# Patient Record
Sex: Male | Born: 1940 | Race: White | Hispanic: No | Marital: Married | Smoking: Never smoker
Health system: Southern US, Community
[De-identification: ages and names within clinical notes are randomized; demographics above are authoritative.]

## PROBLEM LIST (undated history)

## (undated) DIAGNOSIS — F0390 Unspecified dementia without behavioral disturbance: Secondary | ICD-10-CM

## (undated) DIAGNOSIS — H04123 Dry eye syndrome of bilateral lacrimal glands: Secondary | ICD-10-CM

## (undated) DIAGNOSIS — E785 Hyperlipidemia, unspecified: Secondary | ICD-10-CM

## (undated) DIAGNOSIS — C801 Malignant (primary) neoplasm, unspecified: Secondary | ICD-10-CM

## (undated) DIAGNOSIS — R413 Other amnesia: Secondary | ICD-10-CM

## (undated) DIAGNOSIS — M25519 Pain in unspecified shoulder: Secondary | ICD-10-CM

## (undated) DIAGNOSIS — R0789 Other chest pain: Secondary | ICD-10-CM

## (undated) DIAGNOSIS — I10 Essential (primary) hypertension: Secondary | ICD-10-CM

## (undated) DIAGNOSIS — N4889 Other specified disorders of penis: Secondary | ICD-10-CM

## (undated) HISTORY — DX: Dry eye syndrome of bilateral lacrimal glands: H04.123

## (undated) HISTORY — DX: Other specified disorders of penis: N48.89

## (undated) HISTORY — PX: BACK SURGERY: SHX140

## (undated) HISTORY — PX: SHOULDER SURGERY: SHX246

## (undated) HISTORY — PX: CATARACT EXTRACTION, BILATERAL: SHX1313

## (undated) HISTORY — DX: Hyperlipidemia, unspecified: E78.5

## (undated) HISTORY — DX: Other chest pain: R07.89

## (undated) HISTORY — DX: Other amnesia: R41.3

## (undated) HISTORY — DX: Pain in unspecified shoulder: M25.519

## (undated) HISTORY — PX: LUMBAR SPINE SURGERY: SHX701

---

## 2003-02-24 ENCOUNTER — Ambulatory Visit (HOSPITAL_COMMUNITY): Admission: RE | Admit: 2003-02-24 | Discharge: 2003-02-24 | Payer: Self-pay | Admitting: Family Medicine

## 2003-02-24 ENCOUNTER — Encounter: Payer: Self-pay | Admitting: Family Medicine

## 2003-03-20 ENCOUNTER — Encounter: Payer: Self-pay | Admitting: Neurosurgery

## 2003-03-21 ENCOUNTER — Encounter: Payer: Self-pay | Admitting: Neurosurgery

## 2003-03-21 ENCOUNTER — Ambulatory Visit (HOSPITAL_COMMUNITY): Admission: RE | Admit: 2003-03-21 | Discharge: 2003-03-22 | Payer: Self-pay | Admitting: Neurosurgery

## 2004-04-26 ENCOUNTER — Ambulatory Visit (HOSPITAL_COMMUNITY): Admission: RE | Admit: 2004-04-26 | Discharge: 2004-04-26 | Payer: Self-pay | Admitting: Gastroenterology

## 2004-04-26 ENCOUNTER — Encounter (INDEPENDENT_AMBULATORY_CARE_PROVIDER_SITE_OTHER): Payer: Self-pay | Admitting: Specialist

## 2004-12-13 ENCOUNTER — Ambulatory Visit (HOSPITAL_BASED_OUTPATIENT_CLINIC_OR_DEPARTMENT_OTHER): Admission: RE | Admit: 2004-12-13 | Discharge: 2004-12-13 | Payer: Self-pay | Admitting: General Surgery

## 2004-12-13 ENCOUNTER — Encounter (INDEPENDENT_AMBULATORY_CARE_PROVIDER_SITE_OTHER): Payer: Self-pay | Admitting: *Deleted

## 2004-12-13 ENCOUNTER — Ambulatory Visit (HOSPITAL_COMMUNITY): Admission: RE | Admit: 2004-12-13 | Discharge: 2004-12-13 | Payer: Self-pay | Admitting: General Surgery

## 2005-04-27 ENCOUNTER — Ambulatory Visit: Payer: Self-pay | Admitting: Cardiology

## 2007-05-09 ENCOUNTER — Ambulatory Visit: Payer: Self-pay | Admitting: Cardiology

## 2007-05-09 ENCOUNTER — Observation Stay (HOSPITAL_COMMUNITY): Admission: EM | Admit: 2007-05-09 | Discharge: 2007-05-11 | Payer: Self-pay | Admitting: Emergency Medicine

## 2007-06-11 ENCOUNTER — Ambulatory Visit: Payer: Self-pay

## 2007-06-11 ENCOUNTER — Ambulatory Visit: Payer: Self-pay | Admitting: Cardiology

## 2007-06-27 ENCOUNTER — Ambulatory Visit: Payer: Self-pay | Admitting: Cardiology

## 2009-03-29 ENCOUNTER — Emergency Department (HOSPITAL_COMMUNITY): Admission: EM | Admit: 2009-03-29 | Discharge: 2009-03-30 | Payer: Self-pay | Admitting: Emergency Medicine

## 2009-04-13 ENCOUNTER — Ambulatory Visit: Payer: Self-pay | Admitting: Internal Medicine

## 2009-04-22 ENCOUNTER — Ambulatory Visit: Payer: Self-pay | Admitting: Internal Medicine

## 2009-05-04 DIAGNOSIS — I10 Essential (primary) hypertension: Secondary | ICD-10-CM | POA: Insufficient documentation

## 2009-05-04 DIAGNOSIS — Z8679 Personal history of other diseases of the circulatory system: Secondary | ICD-10-CM | POA: Insufficient documentation

## 2009-05-04 DIAGNOSIS — Z87898 Personal history of other specified conditions: Secondary | ICD-10-CM | POA: Insufficient documentation

## 2009-05-04 DIAGNOSIS — C4490 Unspecified malignant neoplasm of skin, unspecified: Secondary | ICD-10-CM

## 2009-05-04 DIAGNOSIS — D126 Benign neoplasm of colon, unspecified: Secondary | ICD-10-CM

## 2010-12-03 ENCOUNTER — Emergency Department (HOSPITAL_COMMUNITY)
Admission: EM | Admit: 2010-12-03 | Discharge: 2010-12-03 | Disposition: A | Discharge status: Home / Self Care | Payer: Medicare HMO | Attending: Emergency Medicine | Admitting: Emergency Medicine

## 2010-12-03 DIAGNOSIS — Z79899 Other long term (current) drug therapy: Secondary | ICD-10-CM | POA: Insufficient documentation

## 2010-12-03 DIAGNOSIS — K5900 Constipation, unspecified: Secondary | ICD-10-CM | POA: Insufficient documentation

## 2010-12-03 DIAGNOSIS — R339 Retention of urine, unspecified: Secondary | ICD-10-CM | POA: Insufficient documentation

## 2010-12-03 DIAGNOSIS — I10 Essential (primary) hypertension: Secondary | ICD-10-CM | POA: Insufficient documentation

## 2010-12-03 DIAGNOSIS — F0390 Unspecified dementia without behavioral disturbance: Secondary | ICD-10-CM | POA: Insufficient documentation

## 2010-12-03 DIAGNOSIS — E785 Hyperlipidemia, unspecified: Secondary | ICD-10-CM | POA: Insufficient documentation

## 2010-12-03 LAB — URINALYSIS, ROUTINE W REFLEX MICROSCOPIC
Bilirubin Urine: NEGATIVE
Nitrite: NEGATIVE
Specific Gravity, Urine: 1.025 (ref 1.005–1.030)
Urobilinogen, UA: 0.2 mg/dL (ref 0.0–1.0)
pH: 5.5 (ref 5.0–8.0)

## 2010-12-03 LAB — URINE MICROSCOPIC-ADD ON

## 2010-12-05 LAB — URINE MICROSCOPIC-ADD ON

## 2010-12-05 LAB — URINALYSIS, ROUTINE W REFLEX MICROSCOPIC
Glucose, UA: NEGATIVE mg/dL
Leukocytes, UA: NEGATIVE
Protein, ur: NEGATIVE mg/dL
Specific Gravity, Urine: 1.02 (ref 1.005–1.030)
pH: 6 (ref 5.0–8.0)

## 2010-12-05 LAB — DIFFERENTIAL
Lymphocytes Relative: 4 % — ABNORMAL LOW (ref 12–46)
Lymphs Abs: 0.8 10*3/uL (ref 0.7–4.0)
Monocytes Relative: 6 % (ref 3–12)
Neutro Abs: 19.7 10*3/uL — ABNORMAL HIGH (ref 1.7–7.7)
Neutrophils Relative %: 91 % — ABNORMAL HIGH (ref 43–77)

## 2010-12-05 LAB — BASIC METABOLIC PANEL
BUN: 26 mg/dL — ABNORMAL HIGH (ref 6–23)
Chloride: 97 mEq/L (ref 96–112)
Creatinine, Ser: 1.12 mg/dL (ref 0.4–1.5)
GFR calc Af Amer: >60 mL/min (ref >60)
GFR calc non Af Amer: >60 mL/min (ref >60)
Potassium: 3.4 mEq/L — ABNORMAL LOW (ref 3.5–5.1)

## 2010-12-05 LAB — URINE CULTURE
Colony Count: NO GROWTH
Culture  Setup Time: 201204072109
Culture: NO GROWTH

## 2010-12-05 LAB — CBC
Platelets: 191 10*3/uL (ref 150–400)
RBC: 5.17 MIL/uL (ref 4.22–5.81)
WBC: 21.7 10*3/uL — ABNORMAL HIGH (ref 4.0–10.5)

## 2010-12-15 ENCOUNTER — Encounter: Payer: Self-pay | Admitting: Family Medicine

## 2010-12-15 DIAGNOSIS — K4090 Unilateral inguinal hernia, without obstruction or gangrene, not specified as recurrent: Secondary | ICD-10-CM | POA: Insufficient documentation

## 2010-12-15 DIAGNOSIS — H04123 Dry eye syndrome of bilateral lacrimal glands: Secondary | ICD-10-CM

## 2010-12-15 DIAGNOSIS — E785 Hyperlipidemia, unspecified: Secondary | ICD-10-CM

## 2010-12-15 DIAGNOSIS — H269 Unspecified cataract: Secondary | ICD-10-CM | POA: Insufficient documentation

## 2010-12-15 DIAGNOSIS — M25519 Pain in unspecified shoulder: Secondary | ICD-10-CM

## 2010-12-15 DIAGNOSIS — N4889 Other specified disorders of penis: Secondary | ICD-10-CM

## 2010-12-15 DIAGNOSIS — R0789 Other chest pain: Secondary | ICD-10-CM

## 2011-01-11 ENCOUNTER — Other Ambulatory Visit: Payer: Self-pay | Admitting: Neurology

## 2011-01-11 DIAGNOSIS — G20 Parkinson's disease: Secondary | ICD-10-CM

## 2011-01-11 DIAGNOSIS — G20A1 Parkinson's disease without dyskinesia, without mention of fluctuations: Secondary | ICD-10-CM

## 2011-01-11 DIAGNOSIS — F079 Unspecified personality and behavioral disorder due to known physiological condition: Secondary | ICD-10-CM

## 2011-01-11 NOTE — H&P (Signed)
NAME:  Joseph Rice, Joseph Rice NO.:  [id number]   MEDICAL RECORD NO.:  [id number]          PATIENT TYPE:  EMS   LOCATION:  MAJO                         FACILITY:  [facility]   PHYSICIAN:  Gerrit Friends. Dietrich Pates, MD, FACCDATE OF BIRTH:  December 20, 1940   DATE OF ADMISSION:  05/09/2007  DATE OF DISCHARGE:                              HISTORY & PHYSICAL   PRIMARY CARE PHYSICIAN:  Ernestina Penna, M.D.   PRIMARY CARDIOLOGIST:  Rollene Rotunda, MD, [facility] (scheduled appointment  but has not seen)   CHIEF COMPLAINT:  Syncope.   HISTORY OF PRESENT ILLNESS:  Joseph Rice is a 70 year old male with no  history of coronary artery disease.  On September 1, he had mowed the  yard with a Firefighter and stated he felt like he did a little faster  than usual, but did not feel like he unusually exerted himself.  After  finishing mowing, he stated he had sudden onset of dizziness, woozy  and felt rubber-legged.  He walked a short distance and woke up on the  ground without injury.  He got up and continued walking toward the  house, but had another syncopal episode and fell again, slightly  injuring his right elbow and breaking a small bone in his right wrist.  He saw Dr. Christell Constant, who referred him to Dr. Antoine Poche, whom he has not yet  seen.  Today, he was again mowing the yard and after finishing mowing  the yard had a similar episode, but this time when the episode began he  sat down on the ground until it fully resolved in about 30 minutes.   Each episode is described as decreasing level of consciousness, as well  as dizziness.  He feels that when he woke up he was immediately aware of  himself and did not have any problems regaining consciousness.  He was  weak afterwards for a short period of time.  He had no associated chest  pain, shortness of breath, diaphoresis, nausea or vomiting.  Nothing  unusual had happened for either episode.  He has never had any heart  palpitations and states that he has  never noticed his heart skip, even  though he is having occasional PVCs on telemetry here.  He has never had  chest pain in his life.  He exercises regularly and feels that he has a  heart healthy lifestyle.   After his episode today, he went to his family physician's office, where  his wife was noted to be very pale and found to have a hemoglobin of  5.5.  She was transported by EMS to the hospital.  When Dr. Christell Constant was  discussing Joseph Rice's situation with him, he had another episode of  presyncope and told Dr. Christell Constant about it.  He was taken immediately to a  stretcher.  An EKG was obtained, which showed sinus rhythm, rate 92.  There were no acute ischemic changes.  Dr. Christell Constant had him transported by  EMS to [facility].  Currently, he is asymptomatic.  Of note, Mr.  Rice stated that although he was exercising shortly before the first  2 episodes occurred, he did not feel that he was dehydrated and felt  like he was drinking plenty of water, although he does drink a fair  amount of diet sodas.   PAST MEDICAL HISTORY:  1. Hypertension.  2. Hyperlipidemia with elevated triglycerides.  3. History of mitral valve prolapse with a remote echocardiogram      performed at [facility].  4. Benign prostatic hypertrophy.  5. Basal cell carcinoma.  6. History of right shoulder problems after a torn rotator cuff      surgery.  7. History of colon polyps.   SURGICAL HISTORY:  Status post right shoulder surgery, as well as back  surgery, basal cell carcinoma removed from his nose and colonoscopy.   ALLERGIES:  SULFAS.   CURRENT MEDICATIONS:  1. Aspirin 81 mg a day.  2. Lipitor 2 tablets q.h.s.  3. Ultram p.r.n.  4. Vaseretic 1/2 tablet a day.  5. Mobic 1/2 tablet a day.  6. Multivitamin daily.  7. Fish oil daily.   SOCIAL HISTORY:  He lives in [place] with his wife and is retired from  [employer].  He has no history of alcohol, tobacco, or drug abuse.  He   exercises regularly at a gymn and feels that he eats a heart healthy  diet.   FAMILY HISTORY:  His mother died at age 66 with no history of coronary  artery disease and his father died at age 20 of a MI.  He has no  siblings with coronary artery disease.   REVIEW OF SYSTEMS:  He has some vision loss.  The syncope and presyncope  are described above.  He has had no hematemesis, hemoptysis or melena.  He has arthralgias.  He has also noticed some tremors recently but has  not ever been evaluated for them.  The tremors are bilateral in his  upper and lower extremities.  He has had no fevers, chills or illnesses.  Review is systems is otherwise negative.   PHYSICAL EXAMINATION:  VITAL SIGNS:  Temperature 97.2, blood pressure  161/96, pulse 95, respiratory rate 20, O2 saturation 99% on room air.  GENERAL:  He is well-developed, well-nourished white male in no acute  distress.  HEENT:  Normal  NECK:  There is no lymphadenopathy, thyromegaly, bruit or JVD noted.  CV:  His heart is regular in rate and rhythm with a S1 and S2 and a  click.  Distal pulses are intact in all 4 extremities.  LUNGS:  Clear to auscultation bilaterally.  SKIN:  No rashes or lesions noted.  ABDOMEN:  Soft and nontender with active bowel sounds.  No  hepatosplenomegaly is noted.  EXTREMITIES:  There is no cyanosis, clubbing or edema noted.  MUSCULOSKELETAL:  His right wrist is in a splint, but there is otherwise  no joint deformity or effusion and no spine or CVA tenderness.  NEURO:  He is alert and oriented.  Cranial nerves II-XII are grossly  intact.  An unintentional benign tremor is noted in upper and lower  extremities.   LABORATORY DATA:  Chest x-ray and labs are pending.   EKG performed in Dr. Kathi Der office is sinus rhythm, rate 87, with no  acute ischemic changes.  Compared to an EKG performed May 01, 2007,  there are PVCs seen on the previous EKG.  There is a left anterior  fascicular block seen on  both and left axis deviation.   IMPRESSION:  He had syncope 10 days ago and near syncope  today.  Both  were after exertion.  He has a history of mitral valve prolapse with a  systolic click, but nothing for idiopathic hypertrophic subaortic  stenosis or AS.  His EKG shows RSR', but no definite Brugada's or other  entities associated with syncope.  He will be admitted and MI will be  ruled out.  We will check orthostatic vital signs and basic labs,  including a TSH.  He will be maintained on telemetry overnight.  If all  is negative, consideration can be given to discharge with outpatient  echocardiogram and further evaluation in the office.      Theodore Demark, PA-C      Gerrit Friends. Dietrich Pates, MD, [facility]  Electronically Signed    RB/MEDQ  D:  05/09/2007  T:  05/09/2007  Job:  [id number]   cc:   Ernestina Penna, M.D.

## 2011-01-11 NOTE — Consult Note (Signed)
NAME:  Joseph Rice, Joseph Rice NO.:  [id number]   MEDICAL RECORD NO.:  [id number]          PATIENT TYPE:  OBV   LOCATION:  4707                         FACILITY:  [facility]   PHYSICIAN:  Genene Churn. Love, M.D.    DATE OF BIRTH:  Sep 29, 1940   DATE OF CONSULTATION:  05/09/2007  DATE OF DISCHARGE:                                 CONSULTATION   This 70 year old right-handed white married male is seen at the request  of [facility] for evaluation of syncope and tremor.   HISTORY OF PRESENT ILLNESS:  Mr. Joseph Rice lives in [place].  His personal physician is Dr. Rudi Heap.  He has been  followed and has been in good health most of his life, but he was  discovered on routine examination that he had evidence of mitral valve  prolapse by Dr. Christell Constant.  Six years ago he was found to have evidence of  hypertension and five years ago had left shoulder surgery.  Following  surgery he has been noted to have evidence of tremor involving both  hands and both arms.  He does not note that the tremor bothers him.  He  has not noticed it in his voice.  He has not noticed it in his  handwriting or had any difficulty in feeding himself.  He has no family  history of tremor and denies any known history of thyroid disease or use  of medications which are associated with tremor.   His mother died at age 44 of unknown cause.  Father died at 26.  He has  four brothers and three sisters.  As best he knows, none of them have  tremor.  He was in his usual state of health until Monday on 04/30/2007  after finishing mowing his yard he fell like he was going to faint.  He  passed out and awoke after hitting the ground.  Is not clear how long he  was unconscious.  There is no associated tongue biting, urinary or bowel  incontinence.  He then stood up and turn his lawn mower off.  He crossed  over his drive way and then felt as if he was going to faint again and  fell striking his  right arm and his right hand.  He did not think very  much else about that but today felt as if he is going to pass out, sat  down and the symptoms were prevented from passing out.  There is no  associated chest pain, palpitations, shortness of breath.  He was seen  in Dr. Kathi Der office and referred to [facility].   PAST MEDICAL HISTORY:  Significant for hypertension for 6 years, basal  cell cancer of the skin, left shoulder surgery 5 years ago, lumbar spine  surgery with left lumbar radiculopathy in 2004.  He does not smoke  cigarettes.  He does not use alcohol.  He has a history of allergy to  shrimp.   MEDICATIONS:  His medications include Vasotec, Mobic and Advicor.   PHYSICAL EXAMINATION:  GENERAL:  Examination revealed a well-developed  white male  lying in bed.  His right hand was in a wrist splint.  VITAL SIGNS:  His blood pressure sitting in the right and left arm  160/80 without change going to the standing position.  The heart rate  was rapid at 104.  NECK:  No bruits were heard.  The neck flexion/extension maneuvers were  unremarkable.  NEUROLOGIC:  Mental status, he was alert, oriented x3, followed one, two  and three-step commands.  Cranial nerve examination revealed both disks  seen and flat.  Extraocular movements were full and corneals were  present.  Facial sensation was equal.  There was no facial motor  asymmetry.  Hearing was present with air conduction greater than bone  conduction.  Tongue was midline.  The uvula was midline.  Gag present.  Motor examination revealed 5/5 strength proximally and distally upper  and lower extremities.  He had outstretched hand and arm tremor.  He had  tremor in his voice.  Sensory examination was intact to pinprick and to  two-point discrimination.  His deep tendon reflexes were 2+ and plantar  responses were downgoing.  Gait examination was unremarkable.  Gait  examination showed he could stand, he could stand on his  toes, he could  stand on his heels but he was hooked to a monitor and full examination  was not performed.   His 12-lead EKG showed left anterior fascicular block.  His CBC has not  returned at this time.  EKG showed left anterior fascicular block.  CBC  and basic metabolic panel have not returned at this time.   IMPRESSION:  1. Syncope, code 780.2.  2. 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   PLAN:  Plan at this time is to obtain an MRI, study an intracranial MRA  to rule out posterior fossa abnormality that could be associated tremor.  Also to obtain a TSH, rule out possibility of thyroid disease.           ______________________________  Genene Churn. Sandria Manly, M.D.     JML/MEDQ  D:  05/09/2007  T:  05/10/2007  Job:  [id number]

## 2011-01-11 NOTE — Procedures (Signed)
EEG NUMBER:  [id number]   AGE:  70.   CLINICAL INFORMATION:  This patient has a history of blackout episodes  x2 with pre-syncope.  No seizure activity has been witnessed.   MEDICATIONS LISTED:  Are Lipitor, Vasotec, aspirin, Ultram.   TECHNICAL DESCRIPTION:  This EEG was recorded during the awake and stage  II sleep states.  The awake background activities is 11-Hz rhythms, with  higher amplitudes seen in the posterior head regions bilaterally.  Photic stimulation was performed which did not produce drive response.  Hyperventilation testing produced no significant abnormalities.  Stage  II sleep was noted during this recording.   IMPRESSION:  This a normal EEG during the awake and stage II sleep  states.           ______________________________  Genene Churn. Sandria Manly, M.D.     JYN:WGNF  D:  05/11/2007 13:44:49  T:  05/12/2007 12:01:53  Job #:  [id number]

## 2011-01-11 NOTE — Assessment & Plan Note (Signed)
[facility]                            CARDIOLOGY OFFICE NOTE   LORENE, SAMAAN             MRN:          [id number]  DATE:06/27/2007                            DOB:          [birth year]    PRIMARY CARE PHYSICIAN:  Ernestina Penna, M.D.   REASON FOR PRESENTATION:  The patient is status post hospitalization for  syncope.   HISTORY OF PRESENT ILLNESS:  The patient was recently hospitalized for  two episodes of syncope.  He had an extensive workup, some of which was  completed as an outpatient.  He was seen by neurology and had an MRI.  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  He had an EEG, the results of  which I do not see available at the time of discharge.  His other workup  included rule out for myocardial infarction.  He was kept on telemetry  and he did show PVCs which he has had.  He has worn an event monitor as  an outpatient but has not transmitted any arrhythmias.  He also had an  echocardiogram which demonstrated no abnormality.  A stress profusion  study demonstrated no evidence of ischemia or infarct.  His ejection  fraction is well-preserved.   PAST MEDICAL HISTORY:  1. Hypertension.  2. Systolic click.  3. Syncope.   ALLERGIES:  SHELLFISH.Marland Kitchen   MEDICATIONS:  1. Aspirin 81 mg daily.  2. Lipitor.  3. Ultram.  4. Vaseretic.  5. Mobic.  6. Fish oil (of note the patient did not know any of his doses and did      not bring his med list with him).   PHYSICAL EXAMINATION:  The patient is in no distress.  Blood pressure 148/102, heart rate 87 and regular, weight 183 pounds,  body mass index 25.  HEENT:  Eyes unremarkable.  Pupils equal, round, and reactive to light.  Fundi not visualized.  Oral mucosa unremarkable.  NECK:  No jugular venous distention.  Carotid upstroke brisk and  symmetric.  No bruits, no thyromegaly.  LYMPHATICS:  No adenopathy.  LUNGS:  Clear to auscultation bilaterally.  BACK:  No costovertebral angle tenderness.  CHEST:  Unremarkable.  HEART:  PMI not displaced or sustained.  S1 and S2 within normal limits,  no S3, no S4, late systolic click.  It seems to loudest with  inspiration.  No murmurs.  ABDOMEN:  Flat, positive bowel sounds.  Normal in frequency, pitch.  No  bruits, no rebound, no guarding, no midline pulsatile mass, no  organomegaly.  SKIN:  No rashes, no nodules.  EXTREMITIES:  2+ pulses, no edema.  NEUROLOGIC:  Grossly intact.   EKG:  Right axis deviation which is a lead reversal from previous.  I  suspect limb lead reversal.  Premature ventricular contractions, no  acute ST/T-wave changes.   ASSESSMENT/PLAN:  1. Syncope.  The patient had an extensive workup of his syncope.  He      has worn an event monitor.  He can go ahead and discontinue this.      He has had no further episodes, despite being  out and exerting      himself.  This was a hot day when this happened.  It may have been      a vagal episode.  No further evaluation is warranted.  2. Mid systolic click.  The patient has a click that is not similar to      mitral valve prolapse.  He also has not had MVP documented on echo.      However, it has been present on repeated echocardiograms.  It does      have respiratory variation.  It might be a pulmonic valve opening      snap, though that typically is softer with inspiration.  The      pulmonic valve was not well-visualized on the echo.  Regardless it      does not seem to be causing any problems or symptoms and this can      be followed clinically.  3. Hypertension.  His blood pressure is elevated.  However, it was      well-controlled in the hospital.  He will continue with the      medicines as listed and follow up with his primary care doctor.  4. Abnormal MRI.  The patient did have a mildly abnormal MRI.  We are      going to make sure he has follow-up with the neurologist.   They can      also review the results of his EEG which are not available.  5. Follow-up.  He can come back to this clinic as needed.     Rollene Rotunda, MD, [facility]  Electronically Signed    JH/MedQ  DD: 06/27/2007  DT: 06/27/2007  Job #: [id number]   cc:   Ernestina Penna, M.D.

## 2011-01-11 NOTE — Discharge Summary (Signed)
NAME:  Joseph Rice, PABST NO.:  [id number]   MEDICAL RECORD NO.:  [id number]          PATIENT TYPE:  OBV   LOCATION:  4707                         FACILITY:  [facility]   PHYSICIAN:  Rollene Rotunda, MD, FACCDATE OF BIRTH:  04/16/1941   DATE OF ADMISSION:  05/09/2007  DATE OF DISCHARGE:  05/11/2007                               DISCHARGE SUMMARY   SUMMARY OF HISTORY:  Mr. Fleischer is a 70 year old white male who had a  syncopal episode on September 1.  He did not sustain any injuries.  This  was preceded by a dizziness and rubber-legged feeling.  He is due to be  scheduled to see Dr. Antoine Poche in the office for further evaluation.  However, on the day of admission again after mowing the yard, he had a  similar episode but he sat down on the ground and it resolved within 30  minutes.  Thus, his admission for further evaluation.   While being evaluated in his family physician's office, his wife was  noted to be very pale and was also admitted to the hospital with a  hemoglobin of 5.5.   Mr. Paulsen history is notable for:  1. Hypertension.  2. Hyperlipidemia with elevated triglycerides.  3. Mitral valve prolapse with remote echocardiogram.  4. BPH.  5. Basal cell carcinoma.  6. Right shoulder problems after a torn rotator cuff surgery.  7. Colon polyps.  8. SULFA allergy.   Chest x-ray on the 10th shows no active disease.  MRI of the head shows  no evidence of acute ischemia, possible reflections of small vessel-type  disease changes.  Possibilities include a demyelinating process or  vasculitis.  No evidence of occlusion, stenosis, dissections or  aneurysms.   LABORATORY:  Admission H&H was 15.4 and 45.2, normal indices, platelets  198, WBC 10.4.  D-dimer 0.34.  Admission sodium 140, potassium 3.8, BUN  20, creatinine 1.03, glucose 110 essentially normal LFTs.  CK totals  were within normal limits.  MBs and relative indices were slightly  elevated.  Troponins  were 0.03 and 0.02.  Fasting lipids showed total  cholesterol 146, triglycerides 58, HDL 47, LDL 87.  TSH 1.607.  EKG  showed normal sinus rhythm, left axis deviation, slightly delayed R-  wave, nonspecific changes.  Telemetry monitor during hospitalization  showed normal sinus rhythm, sinus tachycardia in the low 100.   HOSPITAL COURSE:  Mr. Boursiquot was admitted to 4700 for further  evaluation.  Neurology was asked to consult.  An EKG was performed and  did not show any acute changes.  Neurology also noted an essential  tremor.  He ruled out for myocardial infarction.  Telemetry did not show  any evidence of dysrhythmia.  Several orthostatic blood pressures did  not show any acute changes.  Dr. Antoine Poche on the 12th felt that the  patient could be discharged home with outpatient follow-up.   DISCHARGE DIAGNOSIS:  Syncope of uncertain etiology.   He is asked to continue his home medications:  1. Aspirin 81 mg daily  2. Lipitor, unknown dosage q.h.s.  3. Ultram unknown dosage p.r.n.  4. Vaseretic unknown dosage  daily.  5. Mobic unknown dosage daily.  6. Multivitamin.  7. Fish oil all.   He was asked to bring all medications to all appointments for review.  He will have an echocardiogram and stress Myoview in the [facility]  office on May 22, 2007, at 7:30.  the office will call him with  arrangements for an event monitor.  He will then follow up with Dr.  Antoine Poche in [place] on May 30, 2007, at 1:00 p.m.  He was advised no  driving until complete evaluation.   DISCHARGE TIME:  45 minutes.      Joellyn Rued, PA-C      Rollene Rotunda, MD, [facility]  Electronically Signed    EW/MEDQ  D:  05/11/2007  T:  05/11/2007  Job:  [id number]   cc:   Ernestina Penna, M.D.

## 2011-01-14 ENCOUNTER — Ambulatory Visit
Admission: RE | Admit: 2011-01-14 | Discharge: 2011-01-14 | Disposition: A | Discharge status: Home / Self Care | Payer: Medicare HMO | Source: Ambulatory Visit | Attending: Neurology | Admitting: Neurology

## 2011-01-14 DIAGNOSIS — F079 Unspecified personality and behavioral disorder due to known physiological condition: Secondary | ICD-10-CM

## 2011-01-14 DIAGNOSIS — G20 Parkinson's disease: Secondary | ICD-10-CM

## 2011-01-14 NOTE — Consult Note (Signed)
   NAME:  Joseph Rice, Joseph Rice                ACCOUNT NO.:  [id number]   MEDICAL RECORD NO.:  [id number]                   PATIENT TYPE:  OIB   LOCATION:  3009                                 FACILITY:  [facility]   PHYSICIAN:  Bertram Millard. Dahlstedt, M.D.          DATE OF BIRTH:  August 27, 1941   DATE OF CONSULTATION:  DATE OF DISCHARGE:  03/22/2003                                   CONSULTATION   REASON FOR CONSULTATION:  Difficulty urinating.   BRIEF HISTORY:  A 70 year old male who had surgery on a lumbar disk  yesterday.  He has had urinary difficulty postoperatively.  His post-void  residuals have been up to 500 cc.  Since his last post-void residual was  checked and a catheterization was attempted, the patient and his wife state  that he voided quite a lot.  In three voids, he says he has voided several  100 cc.   Urological consultation was requested for his difficulty voiding.  He does  have a history of urinary frequency and somewhat slow stream preoperatively.  He has never been on any medications for his prostate before.  He has never  been told that he had a large prostate.   Exam was not performed today.   I spoke with the patient and his wife for 15-20 minutes regarding his  urinary issue.  He feels like he is not full at the present time.  He does  not have suprapubic tenderness, and I cannot palpate his bladder.  I think  it is okay to send him home on Flomax.  He has already been given 0.8 mg  today.  He was given a 30-day supply of Flomax 0.4 mg and told to take one  30 minutes after supper.  He will not take another one tonight.   FOLLOW UP:  He will follow up in my office in 2 weeks.  He knows to present  to the emergency room should he have any difficulty voiding before then.                                               Bertram Millard. Dahlstedt, M.D.    SMD/MEDQ  D:  03/22/2003  T:  03/23/2003  Job:  [id number]   cc:   Ernestina Penna, M.D.  [address] [place]  [place] [address]  Fax: [phone]

## 2011-01-14 NOTE — Op Note (Signed)
NAME:  Joseph Rice, Joseph Rice                ACCOUNT NO.:  [id number]   MEDICAL RECORD NO.:  [id number]                   PATIENT TYPE:  OIB   LOCATION:  3009                                 FACILITY:  [facility]   PHYSICIAN:  Coletta Memos, M.D.                  DATE OF BIRTH:  [birth year]   DATE OF PROCEDURE:  03/21/2003  DATE OF DISCHARGE:                                 OPERATIVE REPORT   PREOPERATIVE DIAGNOSES:  1. Far lateral displaced disk, right L4-5.  2. Right L4 radiculopathy.   POSTOPERATIVE DIAGNOSIS:  1. Far lateral displaced disk, right L4-5.  2. Right L4 radiculopathy.   PROCEDURE:  Right far lateral diskectomy with microdissection L4-5.   COMPLICATIONS:  None.   SURGEON:  Coletta Memos, M.D.   ASSISTANT:  Hewitt Shorts, M.D.   INDICATIONS FOR PROCEDURE:  Joseph Rice is a 71 year old gentleman who  presented with severe pain in his left lower extremity. He has a large  herniated disk in the neuroforamen at L4-5. I therefore  recommended and he  agreed to undergo a far lateral  diskectomy at L4-5.   DESCRIPTION OF PROCEDURE:  Joseph Rice was brought to the operating room,  intubated and placed under general anesthesia without difficulty. He was  rolled prone onto the Wilson frame. His arms being frozen at the shoulder  were placed at his side. His back was prepped and he was draped in a sterile  fashion. Preoperative localizing radiographic film was obtained. Using that  as a guide I infiltrated 10 mL of 0.5% Lidocaine with 1:200,000 strength  epinephrine into the paraspinous region and lumbar region.   I opened the skin  with a #10 blade and took this down to the thoracolumbar  fascia sharply. I then exposed the lamina of L4 and the pars  intraarticularis of L4 on the right side. I brought the microscope into the  operative field and then used the high-speed air drill to drill out some of  the lateral portion of the pars and perform a small lateral  facetectomy.   I was able to identify the nerve root and with Dr. Earl Gala assistance we  were able to retract that rostrally and then remove 2 large fragments of  disk. I then went into the disk  space and removed more small fragments of  disk material. I then irrigated the wound. I then inspected the nerve root  and felt that there was no more compression.   I then placed Solu-Medrol over the dorsal ganglion. I then closed the wound  in a layered fashion using Vicryl sutures, reapproximating the thoracolumbar  fascia and subcutaneous tissues. Dermabond was used for a sterile dressing.   The patient was extubated. He was moving all extremities postoperatively.  Coletta Memos, M.D.    KC/MEDQ  D:  03/21/2003  T:  03/22/2003  Job:  [id number]

## 2011-01-14 NOTE — Op Note (Signed)
NAME:  Joseph Rice, Joseph Rice                ACCOUNT NO.:  [id number]   MEDICAL RECORD NO.:  [id number]                   PATIENT TYPE:  AMB   LOCATION:  ENDO                                 FACILITY:  [facility]   PHYSICIAN:  John C. Madilyn Fireman, M.D.                 DATE OF BIRTH:  Oct 10, 1940   DATE OF PROCEDURE:  04/26/2004  DATE OF DISCHARGE:                                 OPERATIVE REPORT   PROCEDURE:  Colonoscopy with polypectomy.   INDICATIONS FOR PROCEDURE:  Average risk colon cancer screening.   DESCRIPTION OF PROCEDURE:  The patient was placed in the left lateral  decubitus position and placed on the pulse monitor with continuous low-flow  oxygen delivered by nasal cannula.  He was sedated with 87.5 mcg IV fentanyl  and 9 mg IV Versed.  The Olympus video colonoscope was inserted into the  rectum and advanced to the cecum, confirmed by transillumination at  McBurney's point and visualization of the ileocecal valve and appendiceal  orifice.  The prep was good.  The cecum, ascending, and transverse colon all  appeared normal with no masses, polyps, diverticula, or other mucosal  abnormalities.  Within the descending colon, there was an 8 mm polyp that  was fulgurated by hot biopsy.  The remainder of the descending as well as  sigmoid and rectum appeared normal with no further polyps, masses,  diverticula, or other mucosal abnormalities.  The scope was then withdrawn,  and the patient returned to the recovery room in stable condition.  He  tolerated the procedure well, and there were no immediate complications.   IMPRESSION:  Descending colon polyp.   PLAN:  Await histology to determine method and interval for future colon  screening.                                               John C. Madilyn Fireman, M.D.    JCH/MEDQ  D:  04/26/2004  T:  04/26/2004  Job:  [id number]   cc:   Ernestina Penna, M.D.  [address] [place]  [place] [address]  Fax: [phone]

## 2011-01-14 NOTE — Op Note (Signed)
NAME:  ZYHIR, CAPPELLA NO.:  [id number]   MEDICAL RECORD NO.:  [id number]          PATIENT TYPE:  AMB   LOCATION:  DSC                          FACILITY:  [facility]   PHYSICIAN:  Anselm Pancoast. 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   OPERATION:  Wide excision and primary closure of basal cell, left  supraclavicular area and right scapular area, each about 2.0 cm size  lesions.   ANESTHESIA:  Local anesthesia.   SURGEON:  Caeden Foots. Zachery Dakins, M.D.   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 Ernestina Penna.  He also has an area on the right scapula that was  biopsied, and both of these were described as basal cell carcinomas,  superficial, with positive margins.  On examination the areas each are kind  of a fusiform, a little over 1.0 cm in the greatest size.  I recommended  that we wide excise with local anesthesia, and the patient is in agreement.   DESCRIPTION OF PROCEDURE:  The area on the back was first done.  I put a  stitch superiorly to the head for orientation, and then ellipsed out the  area.  The subcutaneous tissue was closed with #4-0 Vicryl.  There were a  couple of little bleeders that required suturing with a #3-0 Vicryl and #4-0  Vicryl.  Then the skin was closed with interrupted sutures of #4-0 nylon, a  few of mattress and a few were simple sutures, probably about seven or eight  sutures all total.   Next, with separate instruments, new gloves and a new gown, the patient was  turned over laying on his  back.  We had placed a sterile dressing and  antibiotic ointment on the back.  We treated the area similarly with a  Betadine prep and local anesthesia, about 6 to 7 mL being used.  Then  ellipsed out the area.  I marked this one, so this was a long stitch and a  short stitch laterally.  The long stitch is towards the front for  orientation.  Then it was also closed with the #4-0 Vicryl and #4-0 nylon  sutures.   The patient tolerated the procedure without problems.  He was released.  I  will see him back in the office in about seven to 10 days.  I will probably  remove most of the sutures at that time, but will leave some of the stitches  for a total of about two weeks.  The patient tolerated the procedure well.      WJW/MEDQ  D:  12/13/2004  T:  12/13/2004  Job:  [id number]  cc:   Ernestina Penna, M.D.  Send copy of pathology report also.

## 2011-06-10 LAB — COMPREHENSIVE METABOLIC PANEL
AST: 29
Albumin: 3.6
Alkaline Phosphatase: 73
BUN: 21
Chloride: 103
Chloride: 104
Creatinine, Ser: 1.03
GFR calc Af Amer: >60
Glucose, Bld: 110 — ABNORMAL HIGH
Potassium: 3.6
Potassium: 3.8
Total Bilirubin: 0.6
Total Bilirubin: 0.9
Total Protein: 6.1
Total Protein: 6.7

## 2011-06-10 LAB — LIPID PANEL
LDL Cholesterol: 87
Triglycerides: 58

## 2011-06-10 LAB — DIFFERENTIAL
Basophils Absolute: 0
Basophils Relative: 0
Eosinophils Absolute: 0
Eosinophils Relative: 0
Lymphocytes Relative: 8 — ABNORMAL LOW
Lymphs Abs: 0.8
Monocytes Absolute: 0.5
Monocytes Relative: 5
Neutro Abs: 9.1 — ABNORMAL HIGH
Neutrophils Relative %: 88 — ABNORMAL HIGH

## 2011-06-10 LAB — CK TOTAL AND CKMB (NOT AT ARMC)
CK, MB: 5.8 — ABNORMAL HIGH
Relative Index: 4.4 — ABNORMAL HIGH
Total CK: 133

## 2011-06-10 LAB — CARDIAC PANEL(CRET KIN+CKTOT+MB+TROPI)
CK, MB: 4.9 — ABNORMAL HIGH
Relative Index: 4.3 — ABNORMAL HIGH
Troponin I: 0.02

## 2011-06-10 LAB — CBC
HCT: 45.2
Hemoglobin: 15.4
MCHC: 34.1
MCV: 92.2
Platelets: 198
RBC: 4.9
RDW: 13.7
WBC: 10.4

## 2011-06-10 LAB — TROPONIN I: Troponin I: 0.03

## 2011-06-10 LAB — D-DIMER, QUANTITATIVE: D-Dimer, Quant: 0.34

## 2011-06-10 LAB — TSH: TSH: 1.67

## 2011-08-05 ENCOUNTER — Emergency Department (HOSPITAL_COMMUNITY): Payer: Medicare HMO

## 2011-08-05 ENCOUNTER — Encounter (HOSPITAL_COMMUNITY): Payer: Self-pay | Admitting: *Deleted

## 2011-08-05 ENCOUNTER — Emergency Department (HOSPITAL_COMMUNITY)
Admission: EM | Admit: 2011-08-05 | Discharge: 2011-08-05 | Disposition: A | Discharge status: Home / Self Care | Payer: Medicare HMO | Attending: Emergency Medicine | Admitting: Emergency Medicine

## 2011-08-05 ENCOUNTER — Other Ambulatory Visit: Payer: Self-pay

## 2011-08-05 DIAGNOSIS — M549 Dorsalgia, unspecified: Secondary | ICD-10-CM | POA: Insufficient documentation

## 2011-08-05 DIAGNOSIS — F0280 Dementia in other diseases classified elsewhere without behavioral disturbance: Secondary | ICD-10-CM | POA: Insufficient documentation

## 2011-08-05 DIAGNOSIS — R109 Unspecified abdominal pain: Secondary | ICD-10-CM | POA: Insufficient documentation

## 2011-08-05 DIAGNOSIS — I10 Essential (primary) hypertension: Secondary | ICD-10-CM | POA: Insufficient documentation

## 2011-08-05 DIAGNOSIS — R10819 Abdominal tenderness, unspecified site: Secondary | ICD-10-CM | POA: Insufficient documentation

## 2011-08-05 DIAGNOSIS — H269 Unspecified cataract: Secondary | ICD-10-CM | POA: Insufficient documentation

## 2011-08-05 DIAGNOSIS — Z859 Personal history of malignant neoplasm, unspecified: Secondary | ICD-10-CM | POA: Insufficient documentation

## 2011-08-05 DIAGNOSIS — K5900 Constipation, unspecified: Secondary | ICD-10-CM | POA: Insufficient documentation

## 2011-08-05 DIAGNOSIS — E785 Hyperlipidemia, unspecified: Secondary | ICD-10-CM | POA: Insufficient documentation

## 2011-08-05 HISTORY — DX: Malignant (primary) neoplasm, unspecified: C80.1

## 2011-08-05 HISTORY — DX: Essential (primary) hypertension: I10

## 2011-08-05 HISTORY — DX: Unspecified dementia, unspecified severity, without behavioral disturbance, psychotic disturbance, mood disturbance, and anxiety: F03.90

## 2011-08-05 LAB — URINALYSIS, ROUTINE W REFLEX MICROSCOPIC
Nitrite: NEGATIVE
Protein, ur: NEGATIVE mg/dL
Specific Gravity, Urine: >1.03 — ABNORMAL HIGH (ref 1.005–1.030)
Urobilinogen, UA: 0.2 mg/dL (ref 0.0–1.0)

## 2011-08-05 LAB — COMPREHENSIVE METABOLIC PANEL
ALT: 18 U/L (ref 0–53)
BUN: 23 mg/dL (ref 6–23)
CO2: 26 mEq/L (ref 19–32)
Calcium: 10.2 mg/dL (ref 8.4–10.5)
Creatinine, Ser: 1.03 mg/dL (ref 0.50–1.35)
GFR calc Af Amer: 82 mL/min — ABNORMAL LOW (ref >90)
GFR calc non Af Amer: 71 mL/min — ABNORMAL LOW (ref >90)
Glucose, Bld: 183 mg/dL — ABNORMAL HIGH (ref 70–99)
Sodium: 136 mEq/L (ref 135–145)
Total Protein: 7.6 g/dL (ref 6.0–8.3)

## 2011-08-05 LAB — CBC
Hemoglobin: 16.6 g/dL (ref 13.0–17.0)
MCH: 31 pg (ref 26.0–34.0)
MCHC: 34.2 g/dL (ref 30.0–36.0)
MCV: 90.8 fL (ref 78.0–100.0)

## 2011-08-05 LAB — URINE MICROSCOPIC-ADD ON

## 2011-08-05 LAB — LIPASE, BLOOD: Lipase: 24 U/L (ref 11–59)

## 2011-08-05 MED ORDER — ONDANSETRON HCL 4 MG/2ML IJ SOLN
4.0000 mg | Freq: Once | INTRAMUSCULAR | Status: AC
  Administered 2011-08-05: 4 mg via INTRAVENOUS
  Filled 2011-08-05: qty 2

## 2011-08-05 MED ORDER — FLEET ENEMA 7-19 GM/118ML RE ENEM
1.0000 | ENEMA | Freq: Once | RECTAL | Status: AC
  Administered 2011-08-05: 1 via RECTAL

## 2011-08-05 MED ORDER — MORPHINE SULFATE 2 MG/ML IJ SOLN
2.0000 mg | Freq: Once | INTRAMUSCULAR | Status: AC
  Administered 2011-08-05: 2 mg via INTRAVENOUS
  Filled 2011-08-05: qty 1

## 2011-08-05 MED ORDER — SODIUM CHLORIDE 0.9 % IV BOLUS (SEPSIS)
1000.0000 mL | Freq: Once | INTRAVENOUS | Status: AC
  Administered 2011-08-05: 1000 mL via INTRAVENOUS

## 2011-08-05 MED ORDER — POLYETHYLENE GLYCOL 3350 17 G PO PACK: 17.0000 g | PACK | Freq: Every day | ORAL | Status: AC

## 2011-08-05 NOTE — ED Provider Notes (Signed)
History     CSN: [id number] Arrival date & time: 08/05/2011  2:11 AM   First MD Initiated Contact with Patient 08/05/11 0216      Chief Complaint  Patient presents with  . Abdominal Pain    (Consider location/radiation/quality/duration/timing/severity/associated sxs/prior treatment) Patient is a 70 y.o. male presenting with abdominal pain. The history is provided by the patient and the spouse.  Abdominal Pain The primary symptoms of the illness include abdominal pain. The primary symptoms of the illness do not include fever, shortness of breath or dysuria. The current episode started yesterday. The onset of the illness was gradual. The problem has been gradually worsening.  Associated with: Nothing. Change in bowel habit: No vomiting or diarrhea. Risk factors for an acute abdominal problem include being elderly (No history of abdominal surgeries). Additional symptoms associated with the illness include back pain. Symptoms associated with the illness do not include chills, anorexia or hematuria. Significant associated medical issues do not include gallstones or diverticulitis.   patient with history of dementia and Parkinson's and is a limited historian. His wife provides supplemental history. No nausea vomiting or diarrhea and last bowel movement unknown. Patient and wife both unable to say how long it's been since last normal bowel movement, and wife is concerned that symptoms are related to constipation. After onset of symptoms she gave him a suppository, as well as a fleets enema without relief of symptoms. Moderate in severity. Pain radiates from across abdomen to lower back. No trauma. No skin discoloration or rash. No recent travel or antibiotics. Patient was started on new medications about 2 weeks ago when he was diagnosed with Parkinson's. Has a history of constipation. No known aggravating or alleviating factors.  Past Medical History  Diagnosis Date  . Other specified disorder of  penis   . Other and unspecified hyperlipidemia   . Pain in joint, shoulder region   . Other chest pain   . Dry eyes   . Inguinal hernia   . Cataract   . Hypertension   . Parkinson's disease   . Dementia   . Cancer     Past Surgical History  Procedure Date  . Back surgery     History reviewed. No pertinent family history.  History  Substance Use Topics  . Smoking status: Never Smoker   . Smokeless tobacco: Not on file  . Alcohol Use: No      Review of Systems  Constitutional: Negative for fever and chills.  HENT: Negative for sore throat, neck pain and neck stiffness.   Eyes: Negative for pain.  Respiratory: Negative for shortness of breath.   Cardiovascular: Negative for palpitations and leg swelling.  Gastrointestinal: Positive for abdominal pain. Negative for blood in stool, rectal pain and anorexia.  Genitourinary: Negative for dysuria and hematuria.  Musculoskeletal: Positive for back pain.  Skin: Negative for rash.  Neurological: Negative for headaches.  All other systems reviewed and are negative.    Allergies  Aricept; Lipitor; Lovaza; Penicillins; and Simcor  Home Medications   Current Outpatient Rx  Name Route Sig Dispense Refill  . AMLODIPINE-ATORVASTATIN 5-10 MG PO TABS Oral Take 0.5 tablets by mouth daily.      . ASPIRIN 81 MG PO TABS Oral Take 81 mg by mouth daily.      Marland Kitchen DOCUSATE SODIUM 100 MG PO CAPS Oral Take 100 mg by mouth 3 (three) times daily as needed.      . ENALAPRIL-HYDROCHLOROTHIAZIDE 10-25 MG PO TABS Oral Take 1 tablet  by mouth daily.      Marland Kitchen PITAVASTATIN CALCIUM 4 MG PO TABS Oral Take 4 mg by mouth 1 day or 1 dose.      Marland Kitchen RIVASTIGMINE TARTRATE 4.5 MG PO CAPS Oral Take 4.5 mg by mouth 2 (two) times daily.      Marland Kitchen ALPRAZOLAM 0.25 MG PO TABS Oral Take 0.125 mg by mouth 1 day or 1 dose.     Marland Kitchen AMLODIPINE BESYLATE 5 MG PO TABS Oral Take 5 mg by mouth daily.      . ASPIRIN 81 MG PO TABS Oral Take 81 mg by mouth daily.      Marland Kitchen VITAMIN D3  1000 UNITS PO CAPS Oral Take 2,000 capsules by mouth daily.     . MELOXICAM 15 MG PO TABS Oral Take by mouth daily.     Marland Kitchen FISH OIL 1000 MG PO CAPS Oral Take 1 capsule by mouth daily.      . TRAMADOL HCL 50 MG PO TABS Oral Take 150 mg by mouth every 6 (six) hours as needed.       BP 170/119  Pulse 100  Temp(Src) 98.4 F (36.9 C) (Oral)  Resp 20  Ht 5\' 7"  (1.702 m)  Wt 190 lb (86.183 kg)  BMI 29.76 kg/m2  SpO2 97%  Physical Exam  Constitutional: He appears well-developed and well-nourished.  HENT:  Head: Normocephalic and atraumatic.  Eyes: Conjunctivae and EOM are normal. Pupils are equal, round, and reactive to light.  Neck: Trachea normal and full passive range of motion without pain. Neck supple. No thyromegaly present.  Cardiovascular: Normal rate, regular rhythm, S1 normal, S2 normal, intact distal pulses and normal pulses.     No systolic murmur is present   No diastolic murmur is present  Pulses:      Radial pulses are 2+ on the right side, and 2+ on the left side.  Pulmonary/Chest: Effort normal and breath sounds normal. He has no wheezes. He has no rhonchi. He has no rales. He exhibits no tenderness.  Abdominal: Soft. Normal appearance and bowel sounds are normal. He exhibits no mass. There is no rebound, no guarding, no CVA tenderness and negative Murphy's sign.       Lower abdominal tenderness, mild. No peritonitis. No skin discoloration.  Musculoskeletal: Normal range of motion.  Neurological: He is alert. He has normal strength. No cranial nerve deficit or sensory deficit. He displays a negative Romberg sign. GCS eye subscore is 4. GCS verbal subscore is 5. GCS motor subscore is 6.       Pill-rolling tremor upper extremities  Skin: Skin is warm and dry. No rash noted. He is not diaphoretic. No cyanosis. Nails show no clubbing.  Psychiatric: He has a normal mood and affect. His speech is normal and behavior is normal.       Cooperative and appropriate    ED Course    Procedures (including critical care time)  Results for orders placed during the hospital encounter of 08/05/11  CBC      Component Value Range   WBC 20.2 (*) 4.0 - 10.5 (K/uL)   RBC 5.35  4.22 - 5.81 (MIL/uL)   Hemoglobin 16.6  13.0 - 17.0 (g/dL)   HCT 40.9  81.1 - 91.4 (%)   MCV 90.8  78.0 - 100.0 (fL)   MCH 31.0  26.0 - 34.0 (pg)   MCHC 34.2  30.0 - 36.0 (g/dL)   RDW 78.2  95.6 - 21.3 (%)   Platelets 198  150 - 400 (K/uL)  COMPREHENSIVE METABOLIC PANEL      Component Value Range   Sodium 136  135 - 145 (mEq/L)   Potassium 3.3 (*) 3.5 - 5.1 (mEq/L)   Chloride 97  96 - 112 (mEq/L)   CO2 26  19 - 32 (mEq/L)   Glucose, Bld 183 (*) 70 - 99 (mg/dL)   BUN 23  6 - 23 (mg/dL)   Creatinine, Ser 4.09  0.50 - 1.35 (mg/dL)   Calcium 81.1  8.4 - 10.5 (mg/dL)   Total Protein 7.6  6.0 - 8.3 (g/dL)   Albumin 4.4  3.5 - 5.2 (g/dL)   AST 25  0 - 37 (U/L)   ALT 18  0 - 53 (U/L)   Alkaline Phosphatase 73  39 - 117 (U/L)   Total Bilirubin 1.1  0.3 - 1.2 (mg/dL)   GFR calc non Af Amer 71 (*) >90 (mL/min)   GFR calc Af Amer 82 (*) >90 (mL/min)  LIPASE, BLOOD      Component Value Range   Lipase 24  11 - 59 (U/L)   Ct Abdomen Pelvis Wo Contrast  08/05/2011  *RADIOLOGY REPORT*  Clinical Data: Abdominal pain, radiating to the back; question of constipation.  CT ABDOMEN AND PELVIS WITHOUT CONTRAST  Technique:  Multidetector CT imaging of the abdomen and pelvis was performed following the standard protocol without intravenous contrast.  Comparison: CT of the abdomen and pelvis performed 03/30/2009  Findings: The visualized lung bases are clear.  Diffuse coronary artery calcifications are seen.  The liver and spleen are unremarkable in appearance.  The gallbladder is within normal limits.  The pancreas and adrenal glands are unremarkable.  Nonspecific perinephric stranding is noted bilaterally.  There is no evidence of hydronephrosis.  No renal or ureteral stones are seen.  The kidneys are otherwise  unremarkable in appearance.  No free fluid is identified.  The small bowel is largely decompressed and unremarkable in appearance.  The stomach is within normal limits.  No acute vascular abnormalities are seen. Scattered calcification is noted along the abdominal aorta and its branches.  The appendix is normal in caliber, without evidence for appendicitis.  The colon is unremarkable in appearance; there is redundancy of the sigmoid colon.  Note is made of mild wall thickening along the rectum, possibly reflecting chronic inflammation, given associated mild diffuse presacral stranding.  The rectum is filled with stool, without significant distension.  The bladder is markedly distended and grossly unremarkable in appearance.  The prostate is enlarged, measuring 6.1 cm in transverse dimension, with minimal calcification.  No inguinal lymphadenopathy is seen.  No acute osseous abnormalities are identified.  Diffuse disc space narrowing is noted along the lower thoracic and lumbar spine, with associated vacuum phenomenon.    IMPRESSION:  1.  Mild wall thickening along the rectum may reflect chronic inflammation, given associated mild diffuse presacral stranding. The rectum is filled with stool, without significant distension. 2.  Significantly enlarged prostate noted. 3.  Markedly distended bladder, extending to the level of the umbilicus.  Suggest clinical correlation as to whether the patient has difficulty voiding. 4.  Diffuse degenerative change noted along the lower thoracic and lumbar spine. 5.  Diffuse coronary artery calcifications seen. 6.  Scattered calcification along the abdominal aorta and its branches.  Findings were discussed with Dr. Sunnie Nielsen at 05:08 a.m. on 08/05/2011.  Original Report Authenticated By: Tonia Ghent, M.D.         Date: 08/05/2011  Rate: 89  Rhythm: normal sinus rhythm  QRS Axis: left  Intervals: normal  ST/T Wave abnormalities: nonspecific ST changes  Conduction  Disutrbances:left anterior fascicular block  Narrative Interpretation:   Old EKG Reviewed: none available   Pain control, IV fluids, labs, CT scan.  MDM  Lower abdominal pain and tenderness radiating to lower back, last bowel movement unknown and not improved with glycerin suppository and fleets enema prior to arrival. Labs and CT scan obtained and reviewed. Patient has history of leukocytosis, is afebrile. CT scan shows constipation, likely chronic and distended bladder. Fleets enema provided with significant bowel movement in the ED. Pain resolved and patient is feeling much better. Abdominal exam improved, is soft nontender nondistended. Plan MiraLAX, stool softeners and close primary care followup       Sunnie Nielsen, MD 08/05/11 [id number]

## 2011-08-05 NOTE — ED Notes (Signed)
abd pain since 3 pm,  Given suppository and fleets enema , without relief.

## 2011-08-05 NOTE — ED Notes (Signed)
Pt with short term memory deficit related to dementia; tremors related Parkinson; Pt c/o abd pain to back pain

## 2012-08-31 DIAGNOSIS — R6889 Other general symptoms and signs: Secondary | ICD-10-CM | POA: Insufficient documentation

## 2012-08-31 DIAGNOSIS — G20A1 Parkinson's disease without dyskinesia, without mention of fluctuations: Secondary | ICD-10-CM | POA: Insufficient documentation

## 2012-08-31 DIAGNOSIS — G20 Parkinson's disease: Secondary | ICD-10-CM | POA: Insufficient documentation

## 2012-08-31 DIAGNOSIS — D518 Other vitamin B12 deficiency anemias: Secondary | ICD-10-CM | POA: Insufficient documentation

## 2012-08-31 DIAGNOSIS — F079 Unspecified personality and behavioral disorder due to known physiological condition: Secondary | ICD-10-CM | POA: Insufficient documentation

## 2012-09-13 ENCOUNTER — Emergency Department (HOSPITAL_COMMUNITY): Payer: Medicare HMO

## 2012-09-13 ENCOUNTER — Emergency Department (HOSPITAL_COMMUNITY)
Admission: EM | Admit: 2012-09-13 | Discharge: 2012-09-14 | Disposition: A | Discharge status: Home / Self Care | Payer: Medicare HMO | Attending: Emergency Medicine | Admitting: Emergency Medicine

## 2012-09-13 ENCOUNTER — Encounter (HOSPITAL_COMMUNITY): Payer: Self-pay | Admitting: *Deleted

## 2012-09-13 DIAGNOSIS — Y9239 Other specified sports and athletic area as the place of occurrence of the external cause: Secondary | ICD-10-CM | POA: Insufficient documentation

## 2012-09-13 DIAGNOSIS — S42309A Unspecified fracture of shaft of humerus, unspecified arm, initial encounter for closed fracture: Secondary | ICD-10-CM | POA: Insufficient documentation

## 2012-09-13 DIAGNOSIS — Y9389 Activity, other specified: Secondary | ICD-10-CM | POA: Insufficient documentation

## 2012-09-13 DIAGNOSIS — S42301A Unspecified fracture of shaft of humerus, right arm, initial encounter for closed fracture: Secondary | ICD-10-CM

## 2012-09-13 DIAGNOSIS — W1809XA Striking against other object with subsequent fall, initial encounter: Secondary | ICD-10-CM | POA: Insufficient documentation

## 2012-09-13 DIAGNOSIS — Y92838 Other recreation area as the place of occurrence of the external cause: Secondary | ICD-10-CM | POA: Insufficient documentation

## 2012-09-13 MED ORDER — MORPHINE SULFATE 4 MG/ML IJ SOLN
4.0000 mg | Freq: Once | INTRAMUSCULAR | Status: AC
  Administered 2012-09-13: 4 mg via INTRAVENOUS
  Filled 2012-09-13: qty 1

## 2012-09-13 MED ORDER — OXYCODONE-ACETAMINOPHEN 5-325 MG PO TABS: 1.0000 | ORAL_TABLET | ORAL | Status: DC | PRN

## 2012-09-13 MED ORDER — ONDANSETRON HCL 4 MG/2ML IJ SOLN
4.0000 mg | Freq: Once | INTRAMUSCULAR | Status: AC
  Administered 2012-09-13: 4 mg via INTRAVENOUS
  Filled 2012-09-13: qty 2

## 2012-09-13 MED ORDER — NAPROXEN 500 MG PO TABS: 500.0000 mg | ORAL_TABLET | Freq: Two times a day (BID) | ORAL | Status: DC

## 2012-09-13 NOTE — ED Notes (Signed)
Pt fell while at a gym today and hit right arm on a barbell, c/o pain to right upper arm, denies hitting head or LOC

## 2012-09-13 NOTE — ED Notes (Signed)
Dr. Judd Lien notified of patient's xray results and of patient requesting something for pain.

## 2012-09-13 NOTE — ED Provider Notes (Signed)
History   Scribed for No att. providers found, the patient was seen in room APA17/APA17 . This chart was scribed by Lewanda Rife.   CSN: [id number]  Arrival date & time 09/13/12  2046   First MD Initiated Contact with Patient 09/13/12 2307      Chief Complaint  Patient presents with  . Fall  . Arm Pain    (Consider location/radiation/quality/duration/timing/severity/associated sxs/prior treatment) HPI Joseph Rice is a 72 y.o. male who presents to the Emergency Department complaining of constant moderate to severe right arm pain after tripping over a barbell and hitting the cement floor at the gym.  This occurred just prior to arrival.  He has a remote hx of R shoulder surgery performed 15+ years ago.  Pt denies hitting head and denies loss of consciousness. Pt reports pain increases with movement. Pt reports pt has a hx of parkinson's disease and shoulder surgery.  The patient has hx of dementia - Spouse gives most of information as patient is unable to remember the events.    Level 5 caveat applies secondary to pt's dementia   Past Medical History  Diagnosis Date  . Other specified disorder of penis   . Other and unspecified hyperlipidemia   . Pain in joint, shoulder region   . Other chest pain   . Dry eyes   . Inguinal hernia   . Cataract   . Hypertension   . Parkinson's disease   . Dementia   . Cancer     Past Surgical History  Procedure Date  . Back surgery     History reviewed. No pertinent family history.  History  Substance Use Topics  . Smoking status: Never Smoker   . Smokeless tobacco: Not on file  . Alcohol Use: No      Review of Systems  Unable to perform ROS: Dementia  Neurological: Negative.     Allergies  Aricept; Lipitor; Lovaza; Niacin-simvastatin er; and Penicillins  Home Medications   Current Outpatient Rx  Name  Route  Sig  Dispense  Refill  . ACETAMINOPHEN 500 MG PO TABS   Oral   Take 1,000 mg by mouth once as needed.  For pain         . ALPRAZOLAM 0.25 MG PO TABS   Oral   Take 0.125 mg by mouth daily as needed.          Marland Kitchen AMLODIPINE BESYLATE 5 MG PO TABS   Oral   Take 2.5 mg by mouth every morning.          . ASPIRIN 81 MG PO TABS   Oral   Take 81 mg by mouth at bedtime.          Marland Kitchen VITAMIN D3 1000 UNITS PO CAPS   Oral   Take 2,000 capsules by mouth every morning.          . ENALAPRIL-HYDROCHLOROTHIAZIDE 10-25 MG PO TABS   Oral   Take 1 tablet by mouth every morning.          Marland Kitchen MELOXICAM 15 MG PO TABS   Oral   Take 7.5 mg by mouth every morning.          Marland Kitchen FISH OIL 1000 MG PO CAPS   Oral   Take 1 capsule by mouth daily.           Marland Kitchen PITAVASTATIN CALCIUM 4 MG PO TABS   Oral   Take 4 mg by mouth at bedtime.          Marland Kitchen  PRESCRIPTION MEDICATION   Oral   Take 0.5 tablets by mouth 3 (three) times daily. PARKINSON'S DISEASE-NAME IS UNKNOWN         . RIVASTIGMINE TARTRATE 4.5 MG PO CAPS   Oral   Take 4.5 mg by mouth 2 (two) times daily.           . TRAMADOL HCL 50 MG PO TABS   Oral   Take 50 mg by mouth every 6 (six) hours as needed. For pain         . AMLODIPINE-ATORVASTATIN 5-10 MG PO TABS   Oral   Take 0.5 tablets by mouth daily.           Marland Kitchen NAPROXEN 500 MG PO TABS   Oral   Take 1 tablet (500 mg total) by mouth 2 (two) times daily with a meal.   30 tablet   0   . OXYCODONE-ACETAMINOPHEN 5-325 MG PO TABS   Oral   Take 1 tablet by mouth every 4 (four) hours as needed for pain.   20 tablet   0     BP 126/80  Pulse 82  Temp 98.1 F (36.7 C) (Oral)  Resp 18  Ht 5\' 8"  (1.727 m)  Wt 191 lb (86.637 kg)  BMI 29.04 kg/m2  SpO2 99%  Physical Exam  Nursing note and vitals reviewed. Constitutional: He appears well-developed and well-nourished. No distress.  HENT:  Head: Normocephalic and atraumatic.  Mouth/Throat: No oropharyngeal exudate.       No signs of facial trauma, no malocclusion  Eyes: Conjunctivae normal are normal. No scleral icterus.   Neck: Normal range of motion. Neck supple.  Cardiovascular: Normal rate, regular rhythm and normal heart sounds.        Pulses intact and cap refill normal   Pulmonary/Chest: Effort normal and breath sounds normal. He exhibits no tenderness.  Abdominal: Soft. There is no tenderness.  Musculoskeletal:       Swelling of the mid-upper extemity with ttp in the same location.  Has normal ROM of the elbow and the wrist on the R.  No other extremity injuries  Neurological:       Resting tremor and normal grip on the right and normal gait.  Speech clear but pt disoriented (family member states is at baseline for memory)  Skin: Skin is warm and dry. No rash noted. No erythema.  Psychiatric: He has a normal mood and affect.    ED Course  Procedures (including critical care time)  Labs Reviewed - No data to display Dg Humerus Right  09/13/2012  *RADIOLOGY REPORT*  Clinical Data: Gym injury with fall.  Right upper arm pain.  RIGHT HUMERUS - 2+ VIEW  Comparison: None  Findings: Transverse fracture of the junction of proximal and middle thirds of the humeral shaft noted with mild apex lateral angulation and 1.1 cm of displacement.  There is a small intermediary fragment medially.  Spurring of the radial head noted.  There are considerable degenerative glenohumeral findings.  IMPRESSION: 1.  Acute transverse fracture at the junction of the proximal and middle thirds of the humerus, with mild angulation, mild displacement, and a small intermediary fragment.  2.  Prominent degenerative glenohumeral arthropathy.  Mild spurring of the radial head.   Original Report Authenticated By: Gaylyn Rong, M.D.      1. Fracture of right humerus       MDM  Differential diagnosis includes fracture / contusion / dislocation.  I have personally interpretted the xrays of  the Right Humerus and find there to be a transverse fracture through the midshaft of the R humerus without obvious joint injury to the elbow or  the shoulder.  The patient has a closed fracture of the mid humerus. - I have discussed his care with the Orthopedist Dr. Romeo Apple who recommends a sugar tong with sling of the RUE and follow up on Monday - pt stable for d/c, d/w family members the plan of care and they are in agreement.  Percocet Rx given, Morphine in the ED with good improvement.   I personally performed the services described in this documentation, which was scribed in my presence. The recorded information has been reviewed and is accurate.     Vida Roller, MD 09/14/12 [phone]

## 2012-11-19 ENCOUNTER — Other Ambulatory Visit: Payer: Self-pay | Admitting: Neurology

## 2012-11-26 ENCOUNTER — Telehealth: Payer: Self-pay | Admitting: *Deleted

## 2012-11-26 DIAGNOSIS — E785 Hyperlipidemia, unspecified: Secondary | ICD-10-CM

## 2012-11-26 DIAGNOSIS — I341 Nonrheumatic mitral (valve) prolapse: Secondary | ICD-10-CM

## 2012-11-26 DIAGNOSIS — G20A1 Parkinson's disease without dyskinesia, without mention of fluctuations: Secondary | ICD-10-CM

## 2012-11-26 DIAGNOSIS — G20 Parkinson's disease: Secondary | ICD-10-CM

## 2012-11-26 DIAGNOSIS — Z0000 Encounter for general adult medical examination without abnormal findings: Secondary | ICD-10-CM

## 2012-11-26 DIAGNOSIS — I10 Essential (primary) hypertension: Secondary | ICD-10-CM

## 2012-11-26 DIAGNOSIS — N400 Enlarged prostate without lower urinary tract symptoms: Secondary | ICD-10-CM

## 2012-11-26 NOTE — Telephone Encounter (Signed)
Please give him note

## 2012-11-26 NOTE — Telephone Encounter (Signed)
LMOM

## 2012-11-26 NOTE — Telephone Encounter (Signed)
Would like order for labs including hemocult, if needed.  Last hemocult was 02/07/12.  Would like to come on Wednesday, but I stated to patient that he may need to wait until after 12/11/12.  Please advise.

## 2012-11-30 NOTE — Telephone Encounter (Signed)
Pt.notified

## 2012-12-17 ENCOUNTER — Ambulatory Visit (INDEPENDENT_AMBULATORY_CARE_PROVIDER_SITE_OTHER): Payer: Medicare HMO | Admitting: Family Medicine

## 2012-12-17 ENCOUNTER — Encounter: Payer: Self-pay | Admitting: Family Medicine

## 2012-12-17 ENCOUNTER — Encounter: Payer: Self-pay | Admitting: *Deleted

## 2012-12-17 VITALS — BP 127/70 | HR 71 | Temp 97.0°F | Wt 199.2 lb

## 2012-12-17 DIAGNOSIS — IMO0002 Reserved for concepts with insufficient information to code with codable children: Secondary | ICD-10-CM

## 2012-12-17 DIAGNOSIS — T22031S Burn of unspecified degree of right upper arm, sequela: Secondary | ICD-10-CM

## 2012-12-17 DIAGNOSIS — S42309S Unspecified fracture of shaft of humerus, unspecified arm, sequela: Secondary | ICD-10-CM

## 2012-12-17 MED ORDER — SILVER SULFADIAZINE 1 % EX CREA: TOPICAL_CREAM | Freq: Two times a day (BID) | CUTANEOUS | Status: DC

## 2012-12-17 NOTE — Progress Notes (Signed)
  Subjective:    Patient ID: Joseph Rice, male    DOB: [birth year], 72 y.o.   MRN: [id number]  HPI Upper arm burn secondary to removal of cast from fracture of arm. Cast had been present for 14 week. He also has Parkinson's   Review of Systems History of fracture of arm and had surgery at [facility]. Cast was recently removed and he had like a burn abrasion of his upper arm.    Objective:   Physical Exam Eschars, dry skin, and redness of the entire upper arm circumferentially       Assessment & Plan:  1. First degree burns of upper arm secondary to friction and rubbing of the cast  2. Media these areas were debrided with sterile forceps after cleansing with saline  3. Silvadene cream was applied to the areas  4. Parkinson's  Patient Instructions  Cleanse wounds of right arm with saline solution twice daily with sterile gauze, and the morning after showering and in the evening before applying Silvadene Shower in the morning, gently irrigating wound with water Let  skin dry after showering Gently clean scan at night with saline and sterile gauze, applied Silvadene cream with tongue blade at bedtime Wear white cotton T-shirt has been washed with mild detergent and no fabric softeners at night   Recheck patient in 8 days

## 2012-12-17 NOTE — Patient Instructions (Addendum)
Cleanse wounds of right arm with saline solution twice daily with sterile gauze, and the morning after showering and in the evening before applying Silvadene Shower in the morning, gently irrigating wound with water Let  skin dry after showering Gently clean scan at night with saline and sterile gauze, applied Silvadene cream with tongue blade at bedtime Wear white cotton T-shirt has been washed with mild detergent and no fabric softeners at night

## 2012-12-25 ENCOUNTER — Encounter: Payer: Self-pay | Admitting: Family Medicine

## 2012-12-25 ENCOUNTER — Ambulatory Visit (INDEPENDENT_AMBULATORY_CARE_PROVIDER_SITE_OTHER): Payer: Medicare HMO | Admitting: Family Medicine

## 2012-12-25 VITALS — Temp 96.6°F | Ht 66.0 in

## 2012-12-25 DIAGNOSIS — I10 Essential (primary) hypertension: Secondary | ICD-10-CM

## 2012-12-25 DIAGNOSIS — E785 Hyperlipidemia, unspecified: Secondary | ICD-10-CM

## 2012-12-25 DIAGNOSIS — S42309S Unspecified fracture of shaft of humerus, unspecified arm, sequela: Secondary | ICD-10-CM

## 2012-12-25 DIAGNOSIS — N400 Enlarged prostate without lower urinary tract symptoms: Secondary | ICD-10-CM

## 2012-12-25 DIAGNOSIS — IMO0002 Reserved for concepts with insufficient information to code with codable children: Secondary | ICD-10-CM

## 2012-12-25 DIAGNOSIS — T22031S Burn of unspecified degree of right upper arm, sequela: Secondary | ICD-10-CM

## 2012-12-25 LAB — POCT CBC
HCT, POC: 49.5 % (ref 43.5–53.7)
Lymph, poc: 1.4 (ref 0.6–3.4)
MCH, POC: 31.3 pg — AB (ref 27–31.2)
MCV: 91.2 fL (ref 80–97)
Platelet Count, POC: 212 10*3/uL (ref 142–424)
RBC: 5.4 M/uL (ref 4.69–6.13)
RDW, POC: 13.4 %
WBC: 8.3 10*3/uL (ref 4.6–10.2)

## 2012-12-25 LAB — COMPLETE METABOLIC PANEL WITH GFR
ALT: 15 U/L (ref 0–53)
AST: 21 U/L (ref 0–37)
Alkaline Phosphatase: 84 U/L (ref 39–117)
Chloride: 100 mEq/L (ref 96–112)
Creat: 1.11 mg/dL (ref 0.50–1.35)
Total Bilirubin: 0.5 mg/dL (ref 0.3–1.2)

## 2012-12-25 NOTE — Patient Instructions (Addendum)
Shower normally and use Silvadene for 5-7 days. Will call with lab results. Needs to get ok from orthopedic surgeon about when he can resume regular activities. Do not use arm until cleared from them.

## 2012-12-25 NOTE — Progress Notes (Deleted)
Patient comes in today for rck of friction burn from cast to right arm.  He is much improved and using Silvadene.    Continue Silvadene for 5-7 more days then d/c.   He can shower. Labs to be done today.

## 2012-12-25 NOTE — Progress Notes (Signed)
  Subjective:    Patient ID: Joseph Rice, male    DOB: 22-Nov-1940, 72 y.o.   MRN: [id number]  HPI Patient returns to office today to recheck the friction burn wounds from the cast that had been on his right arm   Review of Systems Negative today for anything associated with his burn    Objective:   Physical Exam The arm and forearm are much much improved. There was no drainage, no sign of any infection. The Silvadene has helped tremendously.       Assessment & Plan:  Friction burn wound almost healed  Continue Silvadene for one more week Return to clinic if needed  Patient Instructions  Shower normally and use Silvadene for 5-7 days. Will call with lab results. Needs to get ok from orthopedic surgeon about when he can resume regular activities. Do not use arm until cleared from them.

## 2012-12-26 LAB — NMR LIPOPROFILE WITH LIPIDS
Cholesterol, Total: 167 mg/dL (ref <200)
HDL Particle Number: 31.3 umol/L (ref >=30.5)
HDL-C: 48 mg/dL (ref >=40)
LDL (calc): 97 mg/dL (ref <100)
LDL Particle Number: 1230 nmol/L — ABNORMAL HIGH (ref <1000)
LP-IR Score: 46 — ABNORMAL HIGH (ref <=45)
Triglycerides: 108 mg/dL (ref <150)
VLDL Size: 48.4 nm — ABNORMAL HIGH (ref <=46.6)

## 2012-12-26 LAB — VITAMIN D 25 HYDROXY (VIT D DEFICIENCY, FRACTURES): Vit D, 25-Hydroxy: 58 ng/mL (ref 30–89)

## 2012-12-27 ENCOUNTER — Other Ambulatory Visit: Payer: Self-pay | Admitting: Family Medicine

## 2013-01-04 ENCOUNTER — Encounter: Payer: Self-pay | Admitting: Neurology

## 2013-01-04 ENCOUNTER — Other Ambulatory Visit: Payer: Self-pay | Admitting: Family Medicine

## 2013-01-04 DIAGNOSIS — D518 Other vitamin B12 deficiency anemias: Secondary | ICD-10-CM

## 2013-01-04 DIAGNOSIS — F079 Unspecified personality and behavioral disorder due to known physiological condition: Secondary | ICD-10-CM

## 2013-01-04 DIAGNOSIS — R6889 Other general symptoms and signs: Secondary | ICD-10-CM

## 2013-01-04 DIAGNOSIS — G20 Parkinson's disease: Secondary | ICD-10-CM

## 2013-01-07 ENCOUNTER — Encounter: Payer: Self-pay | Admitting: Neurology

## 2013-01-07 ENCOUNTER — Ambulatory Visit (INDEPENDENT_AMBULATORY_CARE_PROVIDER_SITE_OTHER): Payer: Medicare HMO | Admitting: Neurology

## 2013-01-07 VITALS — BP 142/88 | HR 90 | Ht 66.0 in | Wt 196.0 lb

## 2013-01-07 DIAGNOSIS — R413 Other amnesia: Secondary | ICD-10-CM

## 2013-01-07 DIAGNOSIS — G20 Parkinson's disease: Secondary | ICD-10-CM

## 2013-01-07 HISTORY — DX: Other amnesia: R41.3

## 2013-01-07 NOTE — Progress Notes (Signed)
Reason for visit: Parkinson's disease  Joseph Rice is an 72 y.o. male  History of present illness:  Joseph Rice is a 72 year old right-handed white male with a history of Parkinson's disease and dementia. The patient has been trying to work out on a regular basis. In January 2014, the patient tripped over an exercise mat at the gym, and he fell over into a weight. The patient fractured his right arm, but this did not require surgery. The patient has not been able to work out on a regular basis because of this. The patient has not had any further falls. The patient continues to have tremors in both arms, and he indicates that he is eating and drinking well. The patient denies any choking, and he denies any problems with sleeping at night. The cognitive issues have remained relatively stable since last seen. The patient has not been driving since he fractured his arm, but he has not had any problems with driving prior to that. The patient returns for an evaluation. The patient is on Exelon for memory, and he is tolerating the medication well.  Past Medical History  Diagnosis Date  . Other specified disorder of penis   . Other and unspecified hyperlipidemia   . Pain in joint, shoulder region   . Other chest pain   . Dry eyes   . Inguinal hernia   . Cataract   . Hypertension   . Parkinson's disease   . Cancer   . Memory disturbance 01/07/2013  . Dementia     Past Surgical History  Procedure Laterality Date  . Back surgery    . Cataract extraction, bilateral    . Shoulder surgery Left   . Lumbar spine surgery      Family History  Problem Relation Age of Onset  . Cancer Mother   . Heart disease Father   . Heart attack Father     Social history:  reports that he has never smoked. He does not have any smokeless tobacco history on file. He reports that he does not drink alcohol or use illicit drugs.  Allergies:  Allergies  Allergen Reactions  . Aricept (Donepezil  Hydrochloride)   . Lipitor (Atorvastatin Calcium)     abd pain  . Lovaza (Omega-3-Acid Ethyl Esters)   . Morphine And Related     confusion  . Niacin-Simvastatin Er     aching  . Penicillins     Medications:  Current Outpatient Prescriptions on File Prior to Visit  Medication Sig Dispense Refill  . acetaminophen (TYLENOL) 500 MG tablet Take 1,000 mg by mouth once as needed. For pain      . ALPRAZolam (XANAX) 0.25 MG tablet Take 0.125 mg by mouth daily as needed.       Marland Kitchen amLODipine (NORVASC) 5 MG tablet TAKE ONE TABLET BY MOUTH ONE TIME DAILY  30 tablet  5  . amLODipine-atorvastatin (CADUET) 5-10 MG per tablet Take 0.5 tablets by mouth daily.        Marland Kitchen aspirin 81 MG tablet Take 81 mg by mouth at bedtime.       . Cholecalciferol (VITAMIN D3) 1000 UNITS CAPS Take 2,000 capsules by mouth every morning.       . enalapril-hydrochlorothiazide (VASERETIC) 10-25 MG per tablet Take 1 tablet by mouth every morning.       . meloxicam (MOBIC) 15 MG tablet Take 7.5 mg by mouth every morning.       . naproxen (NAPROSYN) 500 MG tablet Take 1  tablet (500 mg total) by mouth 2 (two) times daily with a meal.  30 tablet  0  . Omega-3 Fatty Acids (FISH OIL) 1000 MG CAPS Take 1 capsule by mouth daily.        . Pitavastatin Calcium (LIVALO) 4 MG TABS Take 4 mg by mouth at bedtime.       Marland Kitchen PRESCRIPTION MEDICATION Take 0.5 tablets by mouth 3 (three) times daily. PARKINSON'S DISEASE-NAME IS UNKNOWN      . silver sulfADIAZINE (SILVADENE) 1 % cream APPLY LIGHTLY TO AFFECTED AREA AT BEDTIME  20 g  0  . traMADol (ULTRAM) 50 MG tablet Take 50 mg by mouth every 6 (six) hours as needed. For pain       No current facility-administered medications on file prior to visit.    ROS:  Out of a complete 14 system review of symptoms, the patient complains only of the following symptoms, and all other reviewed systems are negative.  Memory, confusion Tremor Change in appetite  Blood pressure 142/88, pulse 90, height 5'  6" (1.676 m), weight 196 lb (88.905 kg).  Physical Exam  General: The patient is alert and cooperative at the time of the examination.  Neuromuscular: The patient has restriction of abduction of both shoulders, up to about 90.  Skin: No significant peripheral edema is noted.   Neurologic Exam  Mental status: Mini-Mental status examination done today shows a total score of 19/30. The patient is able to name 8 animals in 60 seconds.  Cranial nerves: Facial symmetry is present. Speech is normal, no aphasia or dysarthria is noted. Extraocular movements are full. Visual fields are full. Masking of the face is seen.  Motor: The patient has good strength in all 4 extremities.  Coordination: The patient has good finger-nose-finger and heel-to-shin bilaterally. The patient is able to arise from a seated position with the arms crossed. The patient has tremors of both upper extremities, the right side being slightly worse.  Gait and station: The patient has a slightly slowed gait, decreased arm swing and tremor is noted during walking. Tandem gait is slightly unsteady. Romberg is negative. No drift is seen.  Reflexes: Deep tendon reflexes are symmetric.   Assessment/Plan:  One. Parkinson's disease  2. Memory disturbance  The patient will continue the low-dose Sinemet, taking one half tablet of the 25/100 mg tablets 3 times daily. The patient will continue the Exelon 6 mg twice daily. The patient will followup through this office in about 4 months. The patient will try to remain as active as possible.  Marlan Palau MD 01/07/2013 8:29 PM  [facility] [address] [place], [place] [address]  Phone [phone] Fax [phone]

## 2013-02-18 ENCOUNTER — Telehealth: Payer: Self-pay | Admitting: Family Medicine

## 2013-02-18 ENCOUNTER — Other Ambulatory Visit: Payer: Self-pay | Admitting: *Deleted

## 2013-02-18 MED ORDER — ALPRAZOLAM 0.25 MG PO TABS: 0.1250 mg | ORAL_TABLET | Freq: Every day | ORAL | Status: DC | PRN

## 2013-02-18 NOTE — Telephone Encounter (Signed)
LAST RF 01/16/13. LAST OV 12/25/12. CALL IN [facility].

## 2013-02-18 NOTE — Telephone Encounter (Signed)
Spoke with pt's wife Samples to front for pickup She will call if husbands symptoms worsen

## 2013-03-13 ENCOUNTER — Encounter: Payer: Self-pay | Admitting: Family Medicine

## 2013-03-13 ENCOUNTER — Ambulatory Visit (INDEPENDENT_AMBULATORY_CARE_PROVIDER_SITE_OTHER): Payer: Medicare HMO | Admitting: Family Medicine

## 2013-03-13 VITALS — BP 125/72 | HR 70 | Temp 97.0°F | Ht 66.0 in | Wt 193.0 lb

## 2013-03-13 DIAGNOSIS — E785 Hyperlipidemia, unspecified: Secondary | ICD-10-CM

## 2013-03-13 DIAGNOSIS — G20 Parkinson's disease: Secondary | ICD-10-CM

## 2013-03-13 DIAGNOSIS — N400 Enlarged prostate without lower urinary tract symptoms: Secondary | ICD-10-CM

## 2013-03-13 DIAGNOSIS — R413 Other amnesia: Secondary | ICD-10-CM

## 2013-03-13 DIAGNOSIS — D518 Other vitamin B12 deficiency anemias: Secondary | ICD-10-CM

## 2013-03-13 DIAGNOSIS — G20A1 Parkinson's disease without dyskinesia, without mention of fluctuations: Secondary | ICD-10-CM

## 2013-03-13 DIAGNOSIS — I10 Essential (primary) hypertension: Secondary | ICD-10-CM

## 2013-03-13 NOTE — Patient Instructions (Addendum)
Fall precautions discussed Continue current meds and therapeutic lifestyle changes 

## 2013-03-13 NOTE — Progress Notes (Signed)
  Subjective:    Patient ID: Joseph Rice, male    DOB: [birth year], 72 y.o.   MRN: [id number]  HPI The patient returns to the office today for followup of chronic medical problems which include hypertension and hyperlipidemia Parkinson's disease and BPH. He is accompanied by his wife to inform me of his current condition. She says that things have been stable for him other than a recent fall where he stumbled and fell on his left arm. He has a history of a right arm fracture. She thinks that he is doing well. He sees the neurologist, Dr. Lesia Sago about every 6 months.  Review of Systems  Constitutional: Positive for fatigue (slight).  HENT: Negative.   Eyes: Negative.   Respiratory: Negative.   Cardiovascular: Negative.   Gastrointestinal: Negative.   Endocrine: Negative.   Genitourinary: Negative.   Musculoskeletal: Positive for arthralgias (bilateral shoulder pain).  Allergic/Immunologic: Negative.   Neurological: Positive for tremors. Negative for dizziness and headaches.  Hematological: Negative.   Psychiatric/Behavioral: Negative.        Objective:   Physical Exam BP 125/72  Pulse 70  Temp(Src) 97 F (36.1 C) (Oral)  Ht 5\' 6"  (1.676 m)  Wt 193 lb (87.544 kg)  BMI 31.17 kg/m2  The patient appeared well nourished and normally developed, alert and oriented to time and place despite his medical diagnosis of Parkinson's disease .Speech, behavior and judgement appear normal. Vital signs as documented.  Head exam is unremarkable. No scleral icterus or pallor noted. Ears nose and throat appeared normal. Neck is without jugular venous distension, thyromegally, or carotid bruits. Carotid upstrokes are brisk bilaterally. No cervical adenopathy. Lungs are clear anteriorly and posteriorly to auscultation. Normal respiratory effort. Cardiac exam reveals regular rate and rhythm. First and second heart sounds normal.  No murmurs, rubs or gallops.  Abdominal exam reveals normal bowl  sounds, no masses, no organomegaly and no aortic enlargement. No inguinal adenopathy. Extremities are nonedematous and both femoral and pedal pulses are normal. Skin without pallor or jaundice.  Warm and dry, without rash. He does have some abrasions on his knees and arms and face. Neurologic exam reveals normal deep tendon reflexes and normal sensation. He has some gait rigidity and bilateral tremor in both arms.           Assessment & Plan:  1. Parkinson's disease  2. Hyperlipemia  3. Hypertension  4. BPH (benign prostatic hypertrophy)  5. Paralysis agitans  6. Other vitamin B12 deficiency anemia  7. Memory disturbance   Patient Instructions  Fall precautions discussed  Continue current meds and therapeutic lifestyle changes   Lab work is pending and will be done in the future Nyra Capes MD

## 2013-03-15 ENCOUNTER — Other Ambulatory Visit: Payer: Self-pay | Admitting: Neurology

## 2013-04-09 ENCOUNTER — Other Ambulatory Visit: Payer: Self-pay | Admitting: Family Medicine

## 2013-04-11 NOTE — Telephone Encounter (Signed)
Not on med list

## 2013-04-17 ENCOUNTER — Other Ambulatory Visit: Payer: Self-pay | Admitting: Family Medicine

## 2013-04-18 ENCOUNTER — Other Ambulatory Visit: Payer: Self-pay | Admitting: Neurology

## 2013-04-18 ENCOUNTER — Other Ambulatory Visit: Payer: Self-pay | Admitting: *Deleted

## 2013-04-18 MED ORDER — ALPRAZOLAM 0.25 MG PO TABS: 0.1250 mg | ORAL_TABLET | Freq: Every day | ORAL | Status: DC | PRN

## 2013-04-18 NOTE — Telephone Encounter (Signed)
Last filled 02/18/13 #30 1 RF directions are for 1/2 tablet as needed so patient must be taking 1 tablet every day to be out  If approved route to nurse to phone in

## 2013-04-19 NOTE — Telephone Encounter (Signed)
[facility]ed [facility] 

## 2013-05-01 ENCOUNTER — Other Ambulatory Visit: Payer: Self-pay | Admitting: Family Medicine

## 2013-05-08 ENCOUNTER — Other Ambulatory Visit: Payer: Self-pay

## 2013-05-08 MED ORDER — TRAMADOL HCL 50 MG PO TABS: 50.0000 mg | ORAL_TABLET | Freq: Four times a day (QID) | ORAL | Status: DC | PRN

## 2013-05-08 NOTE — Telephone Encounter (Signed)
This is okay to refill her six-month

## 2013-05-08 NOTE — Telephone Encounter (Signed)
Last seen 7/14  DWM   If approved print and route to nurse

## 2013-05-09 NOTE — Telephone Encounter (Signed)
Pt called to pick rx

## 2013-06-10 ENCOUNTER — Ambulatory Visit: Payer: Self-pay | Admitting: Neurology

## 2013-06-13 ENCOUNTER — Telehealth: Payer: Self-pay | Admitting: Family Medicine

## 2013-06-14 ENCOUNTER — Other Ambulatory Visit: Payer: Self-pay | Admitting: *Deleted

## 2013-06-14 MED ORDER — PITAVASTATIN CALCIUM 4 MG PO TABS: 4.0000 mg | ORAL_TABLET | Freq: Every day | ORAL | Status: AC

## 2013-06-14 MED ORDER — TRAMADOL HCL 50 MG PO TABS: 50.0000 mg | ORAL_TABLET | Freq: Four times a day (QID) | ORAL | Status: DC | PRN

## 2013-06-14 NOTE — Telephone Encounter (Signed)
rx refilled.

## 2013-06-17 ENCOUNTER — Telehealth: Payer: Self-pay | Admitting: Neurology

## 2013-06-17 NOTE — Telephone Encounter (Signed)
I called patient. The patient is having vivid dreams at night, acting out dreams. The patient already has alprazolam, he should take 0.25 or 0.5 mg at night.

## 2013-07-04 ENCOUNTER — Other Ambulatory Visit: Payer: Self-pay | Admitting: Family Medicine

## 2013-07-16 ENCOUNTER — Telehealth: Payer: Self-pay | Admitting: Neurology

## 2013-07-16 MED ORDER — QUETIAPINE FUMARATE 25 MG PO TABS: ORAL_TABLET | ORAL | Status: DC

## 2013-07-16 NOTE — Telephone Encounter (Signed)
I called patient, left a message, will call back later. 

## 2013-07-16 NOTE — Telephone Encounter (Signed)
Patient's wife left message that patient is having a hard time sleeping and he is seeing things.  He is taking Xanax 0.5mg  but it doesn't work, seems to make him more agitated.  She would like to know if the doctor can give him something else to help him sleep and calm him down.  She would also like to get patient in sooner than Feb if possible.  [phone]

## 2013-07-16 NOTE — Telephone Encounter (Signed)
I called the wife. The patient is now sundowning, confused around 5 pm, hallucinating.  I will try seroquel for this. The patient had also has some alprazolam to use. The wife is contact me if the Seroquel has a paradoxical effect, or if he is tolerating, and needs more.

## 2013-07-29 ENCOUNTER — Telehealth: Payer: Self-pay | Admitting: Family Medicine

## 2013-08-01 ENCOUNTER — Telehealth: Payer: Self-pay | Admitting: Neurology

## 2013-08-01 ENCOUNTER — Encounter: Payer: Self-pay | Admitting: Family Medicine

## 2013-08-01 ENCOUNTER — Telehealth: Payer: Self-pay | Admitting: *Deleted

## 2013-08-01 ENCOUNTER — Ambulatory Visit (INDEPENDENT_AMBULATORY_CARE_PROVIDER_SITE_OTHER): Payer: Medicare HMO

## 2013-08-01 ENCOUNTER — Ambulatory Visit (INDEPENDENT_AMBULATORY_CARE_PROVIDER_SITE_OTHER): Payer: Medicare HMO | Admitting: Family Medicine

## 2013-08-01 VITALS — BP 130/62 | HR 72 | Temp 96.7°F | Ht 66.0 in | Wt 193.0 lb

## 2013-08-01 DIAGNOSIS — G250 Essential tremor: Secondary | ICD-10-CM

## 2013-08-01 DIAGNOSIS — F29 Unspecified psychosis not due to a substance or known physiological condition: Secondary | ICD-10-CM

## 2013-08-01 DIAGNOSIS — R259 Unspecified abnormal involuntary movements: Secondary | ICD-10-CM

## 2013-08-01 DIAGNOSIS — H5316 Psychophysical visual disturbances: Secondary | ICD-10-CM

## 2013-08-01 DIAGNOSIS — G20 Parkinson's disease: Secondary | ICD-10-CM

## 2013-08-01 DIAGNOSIS — R410 Disorientation, unspecified: Secondary | ICD-10-CM

## 2013-08-01 DIAGNOSIS — R251 Tremor, unspecified: Secondary | ICD-10-CM

## 2013-08-01 DIAGNOSIS — R5381 Other malaise: Secondary | ICD-10-CM

## 2013-08-01 DIAGNOSIS — R441 Visual hallucinations: Secondary | ICD-10-CM

## 2013-08-01 LAB — GLUCOSE, POCT (MANUAL RESULT ENTRY): POC Glucose: 93 mg/dl (ref 70–99)

## 2013-08-01 LAB — POCT URINALYSIS DIPSTICK
Bilirubin, UA: NEGATIVE
Glucose, UA: NEGATIVE
Ketones, UA: NEGATIVE
Spec Grav, UA: 1.015
Urobilinogen, UA: NEGATIVE

## 2013-08-01 LAB — POCT UA - MICROSCOPIC ONLY
Bacteria, U Microscopic: NEGATIVE
Casts, Ur, LPF, POC: NEGATIVE
Mucus, UA: NEGATIVE
WBC, Ur, HPF, POC: NEGATIVE

## 2013-08-01 LAB — POCT CBC
Granulocyte percent: 81 %G — AB (ref 37–80)
HCT, POC: 54.8 % — AB (ref 43.5–53.7)
Lymph, poc: 1.5 (ref 0.6–3.4)
MCHC: 31.3 g/dL — AB (ref 31.8–35.4)
MPV: 8 fL (ref 0–99.8)
POC Granulocyte: 8.7 — AB (ref 2–6.9)
POC LYMPH PERCENT: 14 %L (ref 10–50)
RDW, POC: 13.6 %

## 2013-08-01 MED ORDER — QUETIAPINE FUMARATE 50 MG PO TABS: 50.0000 mg | ORAL_TABLET | Freq: Three times a day (TID) | ORAL | Status: DC

## 2013-08-01 MED ORDER — QUETIAPINE FUMARATE 50 MG PO TABS: ORAL_TABLET | ORAL | Status: DC

## 2013-08-01 NOTE — Patient Instructions (Addendum)
Increase seroquel as directed, the 50 mg 3 times daily for 2-3 days. If his symptoms have not abated and he is still hallucinating and having trouble going to sleep at another 50 mg at bedtime which would be 100 mg at bedtime and 50 mg in the morning and 50 at supper I spoke with the neurologist he will be calling and 37 with an appointment to evaluate the patient again We will get lab work today and we will make sure that the neurologist has a copy of all of this We will also get a chest x-ray as you were leaving Try to get plenty of fluids in

## 2013-08-01 NOTE — Telephone Encounter (Signed)
Dr. Christell Constant called concerning Joseph Rice. The patient has Parkinson's disease and dementia, and a moderate range of dementia when last seen in May of 2014. The patient has worsened with confusion, and hallucinations. The patient is not sleeping well at night, on Seroquel at 25 mg. We will go up to 50 mg of Seroquel at night, and the patient will need to be seen within the next several days. The dementia has progressed significantly.

## 2013-08-01 NOTE — Telephone Encounter (Signed)
Dr. Anne Hahn would like to see patient within next 5 day. He has opening tomorrow at 11. Left msg for patient/family to call back to confirm or make appt.

## 2013-08-01 NOTE — Progress Notes (Signed)
Subjective:    Patient ID: Joseph Rice, male    DOB: 10/07/1940, 72 y.o.   MRN: [id number]  HPI Patient here today for worsened memory, alzheimer's, and hallucinations at night. Patient comes to the office visit today with his wife. His condition has deteriorated significantly since he was last seen. He does not have a visit scheduled until February with the neurologist. Over the past month his tremors have gotten much worse, his appetite has diminished somewhat to the point that he can only eat certain types of foods that are more palatable. She indicates that the hallucinations have progressed significantly and that they were occurring even in the office when he saw the blood pressure cuff ,he was calling it a witch.she also indicates that his urines are darker than usual his bowel movements seemed to be okay and he may have a slight accident with his bladder at times. She indicates that she has to take care of all of his daily living activities with shaving and clothing.      Patient Active Problem List   Diagnosis Date Noted  . Memory disturbance 01/07/2013  . Other vitamin B12 deficiency anemia 08/31/2012  . Other general symptoms(780.99) 08/31/2012  . 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 Paralysis agitans 08/31/2012  . Other specified disorder of penis   . Other and unspecified hyperlipidemia   . Pain in joint, shoulder region   . Other chest pain   . Dry eyes   . Inguinal hernia   . Cataract   . CARCINOMA, BASAL CELL 05/04/2009  . COLONIC POLYPS 05/04/2009  . HYPERTENSION 05/04/2009  . MITRAL VALVE PROLAPSE, HX OF 05/04/2009  . BENIGN PROSTATIC HYPERTROPHY, HX OF 05/04/2009   Outpatient Encounter Prescriptions as of 08/01/2013  Medication Sig  . ALPRAZolam (XANAX) 0.25 MG tablet Take 0.5 tablets (0.125 mg total) by mouth daily as needed.  Marland Kitchen amLODipine (NORVASC) 5 MG tablet TAKE ONE TABLET BY MOUTH ONE TIME DAILY  . aspirin  81 MG tablet Take 81 mg by mouth at bedtime.   . carbidopa-levodopa (SINEMET IR) 25-100 MG per tablet TAKE ONE HALF TABLET BY MOUTH THREE TIMES DAILY  . enalapril-hydrochlorothiazide (VASERETIC) 10-25 MG per tablet TAKE ONE TABLET BY MOUTH ONE TIME DAILY  . escitalopram (LEXAPRO) 10 MG tablet TAKE 1 TABLET BY MOUTH DAILY AS INSTRUCTED  . meloxicam (MOBIC) 15 MG tablet TAKE ONE TABLET BY MOUTH ONE TIME DAILY  . Pitavastatin Calcium (LIVALO) 4 MG TABS Take 1 tablet (4 mg total) by mouth at bedtime.  Marland Kitchen QUEtiapine (SEROQUEL) 25 MG tablet One half tablet at 5 pm and one tablet at night  . rivastigmine (EXELON) 6 MG capsule TAKE ONE CAPSULE BY MOUTH TWICE DAILY  . traMADol (ULTRAM) 50 MG tablet Take 1 tablet (50 mg total) by mouth every 6 (six) hours as needed. For pain  . Cholecalciferol (VITAMIN D3) 1000 UNITS CAPS Take 2,000 capsules by mouth every morning.   . [DISCONTINUED] Omega-3 Fatty Acids (FISH OIL) 1000 MG CAPS Take 1 capsule by mouth daily.    . [DISCONTINUED] PRESCRIPTION MEDICATION Take 0.5 tablets by mouth 3 (three) times daily. PARKINSON'S DISEASE-NAME IS UNKNOWN    Review of Systems  Constitutional: Negative.   HENT: Negative.   Eyes: Negative.   Respiratory: Negative.   Cardiovascular: Negative.   Gastrointestinal: Negative.   Endocrine: Negative.   Genitourinary: Negative.   Musculoskeletal: Negative.   Skin: Negative.   Allergic/Immunologic: Negative.   Neurological: Negative.  Hematological: Negative.   Psychiatric/Behavioral: Positive for hallucinations, confusion and agitation.       Objective:   Physical Exam  Nursing note and vitals reviewed. Constitutional: He appears well-developed and well-nourished. He appears distressed.  HENT:  Head: Normocephalic and atraumatic.  Right Ear: External ear normal.  Left Ear: External ear normal.  Nose: Nose normal.  Mouth/Throat: Oropharynx is clear and moist. No oropharyngeal exudate.  Eyes: Conjunctivae and EOM are  normal. Pupils are equal, round, and reactive to light. Left eye exhibits no discharge. No scleral icterus.  Neck: Normal range of motion. Neck supple. No thyromegaly present.  Has nuchal rigidity  Cardiovascular: Normal rate, normal heart sounds and intact distal pulses.   No murmur heard. Pulmonary/Chest: Effort normal and breath sounds normal. No respiratory distress. He has no wheezes. He has no rales.  Abdominal: Soft. Bowel sounds are normal. He exhibits no distension. There is no tenderness. There is no rebound and no guarding.  Musculoskeletal: He exhibits tenderness (tenderness in right shoulder). He exhibits no edema.  Generalized stiffness and rigidity in all extremities patient was barely able to get up on the table and we did this only with assistance from both his wife and myself. He was actually not able to even slide back in position on the table.  Lymphadenopathy:    He has no cervical adenopathy.  Neurological: He is alert.  Skin: Skin is warm and dry. No rash noted. No erythema. No pallor.  Psychiatric:  His mood and affect were flat and he seemed to be confused about discussing the fact that he was having hallucinations. When he was confronted with the hallucinations question he seemed to get somewhat tearful her and acknowledged that he did not think he was having hallucinations.   BP 130/62  Pulse 72  Temp(Src) 96.7 F (35.9 C) (Oral)  Ht 5\' 6"  (1.676 m)  Wt 193 lb (87.544 kg)  BMI 31.17 kg/m2  Following the patient in visit today I did call the patient's neurologist and he agreed to see the patient and the next few days. He agreed with increasing the dose of the Seroquel. Results for orders placed in visit on 08/01/13  POCT CBC      Result Value Range   WBC 10.8 (*) 4.6 - 10.2 K/uL   Lymph, poc 1.5  0.6 - 3.4   POC LYMPH PERCENT 14.0  10 - 50 %L   MID (cbc)    0 - 0.9   POC MID %    0 - 12 %M   POC Granulocyte 8.7 (*) 2 - 6.9   Granulocyte percent 81.0 (*) 37  - 80 %G   RBC 5.8  4.69 - 6.13 M/uL   Hemoglobin 17.2  14.1 - 18.1 g/dL   HCT, POC 16.1 (*) 09.6 - 53.7 %   MCV 95.3  80 - 97 fL   MCH, POC 29.8  27 - 31.2 pg   MCHC 31.3 (*) 31.8 - 35.4 g/dL   RDW, POC 04.5     Platelet Count, POC 237.0  142 - 424 K/uL   MPV 8.0  0 - 99.8 fL  GLUCOSE, POCT (MANUAL RESULT ENTRY)      Result Value Range   POC Glucose 93  70 - 99 mg/dl    [facility] reading (PRIMARY) by  Dr.Ewing Fandino-chest x-ray  Assessment & Plan:  1. Confusion - POCT CBC - Hepatic function panel - BMP8+EGFR - Vit D  25 hydroxy (rtn osteoporosis monitoring) - POCT glucose (manual entry) - POCT UA - Microscopic Only; Future - POCT urinalysis dipstick; Future  2. Other malaise and fatigue - POCT CBC - Hepatic function panel - BMP8+EGFR - Vit D  25 hydroxy (rtn osteoporosis monitoring) - POCT glucose (manual entry) - POCT UA - Microscopic Only; Future - POCT urinalysis dipstick; Future  3. Occasional tremors - POCT CBC - Hepatic function panel - BMP8+EGFR - Vit D  25 hydroxy (rtn osteoporosis monitoring) - POCT glucose (manual entry) - POCT UA - Microscopic Only; Future - POCT urinalysis dipstick; Future  4. Parkinson's disease  5. Visual hallucinations  6. Tremor, essential, worsening Meds ordered this encounter  Medications  . DISCONTD: QUEtiapine (SEROQUEL) 50 MG tablet    Sig: Take 1 tablet (50 mg total) by mouth 3 (three) times daily.    Dispense:  90 tablet    Refill:  0  . QUEtiapine (SEROQUEL) 50 MG tablet    Sig: 1 to 2 tabs three times daily until further directed    Dispense:  180 tablet    Refill:  0   Patient Instructions  Increase seroquel as directed, the 50 mg 3 times daily for 2-3 days. If his symptoms have not abated and he is still hallucinating and having trouble going to sleep at another 50 mg at bedtime which would be 100 mg at bedtime and 50 mg in the morning and 50 at supper I spoke with the neurologist he  will be calling and 37 with an appointment to evaluate the patient again We will get lab work today and we will make sure that the neurologist has a copy of all of this We will also get a chest x-ray as you were leaving Try to get plenty of fluids in   Nyra Capes MD

## 2013-08-02 ENCOUNTER — Encounter: Payer: Self-pay | Admitting: *Deleted

## 2013-08-02 LAB — BMP8+EGFR
BUN/Creatinine Ratio: 23 — ABNORMAL HIGH (ref 10–22)
BUN: 27 mg/dL (ref 8–27)
Creatinine, Ser: 1.18 mg/dL (ref 0.76–1.27)
GFR calc Af Amer: 70 mL/min/{1.73_m2} (ref >59)
GFR calc non Af Amer: 60 mL/min/{1.73_m2} (ref >59)
Glucose: 88 mg/dL (ref 65–99)

## 2013-08-02 LAB — HEPATIC FUNCTION PANEL
Albumin: 4.6 g/dL (ref 3.5–4.8)
Alkaline Phosphatase: 96 IU/L (ref 39–117)
Total Protein: 7 g/dL (ref 6.0–8.5)

## 2013-08-02 LAB — VITAMIN D 25 HYDROXY (VIT D DEFICIENCY, FRACTURES): Vit D, 25-Hydroxy: 45.6 ng/mL (ref 30.0–100.0)

## 2013-08-02 NOTE — Telephone Encounter (Signed)
This is okay, still follow the directions I gave her with the Seroquel. Use the Xanax if needed to supplement this

## 2013-08-02 NOTE — Telephone Encounter (Signed)
[place] called this morning to inform Dr. Christell Constant that she gave Joseph Rice his medication last night as instructed. He slept about 2 - 3 hrs, woke up wanting to go out. Then she gave him a xanax to get back to sleep again.

## 2013-08-02 NOTE — Telephone Encounter (Signed)
Called wife back with Dr. Kathi Der instructions. She stated that he is having a good day and hopefully this will continue.

## 2013-08-04 ENCOUNTER — Other Ambulatory Visit: Payer: Self-pay | Admitting: Family Medicine

## 2013-08-05 ENCOUNTER — Other Ambulatory Visit: Payer: Self-pay

## 2013-08-05 ENCOUNTER — Emergency Department (HOSPITAL_COMMUNITY)
Admission: EM | Admit: 2013-08-05 | Discharge: 2013-08-05 | Disposition: A | Discharge status: Home / Self Care | Payer: Medicare HMO | Attending: Emergency Medicine | Admitting: Emergency Medicine

## 2013-08-05 ENCOUNTER — Emergency Department (HOSPITAL_COMMUNITY): Payer: Medicare HMO

## 2013-08-05 ENCOUNTER — Encounter (HOSPITAL_COMMUNITY): Payer: Self-pay | Admitting: Emergency Medicine

## 2013-08-05 ENCOUNTER — Telehealth: Payer: Self-pay | Admitting: Neurology

## 2013-08-05 DIAGNOSIS — I10 Essential (primary) hypertension: Secondary | ICD-10-CM | POA: Insufficient documentation

## 2013-08-05 DIAGNOSIS — F0280 Dementia in other diseases classified elsewhere without behavioral disturbance: Secondary | ICD-10-CM | POA: Insufficient documentation

## 2013-08-05 DIAGNOSIS — Z859 Personal history of malignant neoplasm, unspecified: Secondary | ICD-10-CM | POA: Insufficient documentation

## 2013-08-05 DIAGNOSIS — Z7982 Long term (current) use of aspirin: Secondary | ICD-10-CM | POA: Insufficient documentation

## 2013-08-05 DIAGNOSIS — G20A1 Parkinson's disease without dyskinesia, without mention of fluctuations: Secondary | ICD-10-CM | POA: Insufficient documentation

## 2013-08-05 DIAGNOSIS — Z8739 Personal history of other diseases of the musculoskeletal system and connective tissue: Secondary | ICD-10-CM | POA: Insufficient documentation

## 2013-08-05 DIAGNOSIS — Z791 Long term (current) use of non-steroidal anti-inflammatories (NSAID): Secondary | ICD-10-CM | POA: Insufficient documentation

## 2013-08-05 DIAGNOSIS — R5381 Other malaise: Secondary | ICD-10-CM | POA: Insufficient documentation

## 2013-08-05 DIAGNOSIS — Z880 Allergy status to penicillin: Secondary | ICD-10-CM | POA: Insufficient documentation

## 2013-08-05 DIAGNOSIS — R279 Unspecified lack of coordination: Secondary | ICD-10-CM | POA: Insufficient documentation

## 2013-08-05 DIAGNOSIS — E785 Hyperlipidemia, unspecified: Secondary | ICD-10-CM | POA: Insufficient documentation

## 2013-08-05 DIAGNOSIS — Z87448 Personal history of other diseases of urinary system: Secondary | ICD-10-CM | POA: Insufficient documentation

## 2013-08-05 DIAGNOSIS — Z79899 Other long term (current) drug therapy: Secondary | ICD-10-CM | POA: Insufficient documentation

## 2013-08-05 DIAGNOSIS — G20 Parkinson's disease: Secondary | ICD-10-CM | POA: Insufficient documentation

## 2013-08-05 DIAGNOSIS — R5383 Other fatigue: Secondary | ICD-10-CM

## 2013-08-05 DIAGNOSIS — Z8719 Personal history of other diseases of the digestive system: Secondary | ICD-10-CM | POA: Insufficient documentation

## 2013-08-05 LAB — COMPREHENSIVE METABOLIC PANEL
AST: 18 U/L (ref 0–37)
Albumin: 3.8 g/dL (ref 3.5–5.2)
Alkaline Phosphatase: 80 U/L (ref 39–117)
BUN: 28 mg/dL — ABNORMAL HIGH (ref 6–23)
Chloride: 97 mEq/L (ref 96–112)
Glucose, Bld: 91 mg/dL (ref 70–99)
Potassium: 2.9 mEq/L — ABNORMAL LOW (ref 3.5–5.1)
Total Bilirubin: 0.6 mg/dL (ref 0.3–1.2)
Total Protein: 7 g/dL (ref 6.0–8.3)

## 2013-08-05 LAB — URINALYSIS, ROUTINE W REFLEX MICROSCOPIC
Bilirubin Urine: NEGATIVE
Glucose, UA: NEGATIVE mg/dL
Hgb urine dipstick: NEGATIVE
Ketones, ur: NEGATIVE mg/dL
Nitrite: NEGATIVE
Protein, ur: NEGATIVE mg/dL
pH: 6.5 (ref 5.0–8.0)

## 2013-08-05 LAB — CBC WITH DIFFERENTIAL/PLATELET
Eosinophils Absolute: 0.2 10*3/uL (ref 0.0–0.7)
Eosinophils Relative: 2 % (ref 0–5)
HCT: 47.9 % (ref 39.0–52.0)
Hemoglobin: 16.8 g/dL (ref 13.0–17.0)
Lymphocytes Relative: 15 % (ref 12–46)
Lymphs Abs: 1.2 10*3/uL (ref 0.7–4.0)
MCH: 32.4 pg (ref 26.0–34.0)
MCV: 92.3 fL (ref 78.0–100.0)
Monocytes Absolute: 0.7 10*3/uL (ref 0.1–1.0)
Monocytes Relative: 9 % (ref 3–12)
RBC: 5.19 MIL/uL (ref 4.22–5.81)
WBC: 8.4 10*3/uL (ref 4.0–10.5)

## 2013-08-05 MED ORDER — POTASSIUM CHLORIDE CRYS ER 20 MEQ PO TBCR: 40.0000 meq | EXTENDED_RELEASE_TABLET | Freq: Once | ORAL | Status: DC

## 2013-08-05 MED ORDER — SODIUM CHLORIDE 0.9 % IV BOLUS (SEPSIS)
500.0000 mL | Freq: Once | INTRAVENOUS | Status: AC
  Administered 2013-08-05: 500 mL via INTRAVENOUS

## 2013-08-05 MED ORDER — POTASSIUM CHLORIDE 10 MEQ/100ML IV SOLN
10.0000 meq | INTRAVENOUS | Status: AC
  Administered 2013-08-05 (×2): 10 meq via INTRAVENOUS
  Filled 2013-08-05 (×2): qty 100

## 2013-08-05 MED ORDER — ONDANSETRON HCL 4 MG/2ML IJ SOLN: 4.0000 mg | Freq: Once | INTRAMUSCULAR | Status: DC

## 2013-08-05 MED ORDER — POTASSIUM CHLORIDE CRYS ER 20 MEQ PO TBCR
40.0000 meq | EXTENDED_RELEASE_TABLET | Freq: Once | ORAL | Status: AC
  Administered 2013-08-05: 40 meq via ORAL
  Filled 2013-08-05: qty 2

## 2013-08-05 MED ORDER — POTASSIUM CHLORIDE 10 MEQ/100ML IV SOLN: 10.0000 meq | Freq: Once | INTRAVENOUS | Status: DC

## 2013-08-05 NOTE — ED Notes (Signed)
Per EMS, pt from home with weakness since 1000 this morning. Wife caught him this morning when he was walking. Pt is normally able to stand and walk but hasn't been able to since this morning. Grip is weak bilaterally but equal. 97 % on RA. Speech is slurred per wife and not making any sense. 125/77, 98 HR, 20g LFA. Pt had been to MD on 5th and seroquel dose was increased. Does have hx of parkinsons and dementia

## 2013-08-05 NOTE — Telephone Encounter (Signed)
Patient's wife called to cancel patient's appointment tomorrow 12/9 since patient fell yesterday and is being brought to the hospital today, they think he may have had a light stroke.

## 2013-08-05 NOTE — ED Provider Notes (Signed)
CSN: [id number]     Arrival date & time 08/05/13  1704 History   First MD Initiated Contact with Patient 08/05/13 1715     Chief Complaint  Patient presents with  . Weakness   (Consider location/radiation/quality/duration/timing/severity/associated sxs/prior Treatment) HPI Chief complaint weakness, gait problems history provided by wife at bedside  Patient is a 72 year old male past medical history significant for Parkinson's disease, lewy body dementia who comes in today with chief complaint of weakness and worsening ataxia. Patient has been at his baseline until approximately 4-5 days ago. At that point family noticed he seemed weaker than usual. He was not walking around as much. Sleeping more. And seemed generally fatigued. This is been constant for 4-5 days. Is unchanged. Moderate in severity. Patient also reported to have worsening ataxia. At baseline he has shuffling gait and trouble walking. However over the last several days noticed left leg with worsening function and shuffling. Patient has been caught by family multiple times prior to falling. Discussed with neurologist who recommended coming to the ED for evaluation   Past Medical History  Diagnosis Date  . Other specified disorder of penis   . Other and unspecified hyperlipidemia   . Pain in joint, shoulder region   . Other chest pain   . Dry eyes   . Inguinal hernia   . Cataract   . Hypertension   . Parkinson's disease   . Cancer   . Memory disturbance 01/07/2013  . Dementia    Past Surgical History  Procedure Laterality Date  . Back surgery    . Cataract extraction, bilateral    . Shoulder surgery Left   . Lumbar spine surgery     Family History  Problem Relation Age of Onset  . Cancer Mother   . Heart disease Father   . Heart attack Father    History  Substance Use Topics  . Smoking status: Never Smoker   . Smokeless tobacco: Not on file  . Alcohol Use: No    Review of Systems  Unable to perform ROS:  Dementia    Allergies  Aricept; Lipitor; Lovaza; Morphine and related; Niacin-simvastatin er; and Penicillins  Home Medications   Current Outpatient Rx  Name  Route  Sig  Dispense  Refill  . ALPRAZolam (XANAX) 0.25 MG tablet   Oral   Take 0.125 mg by mouth daily as needed for anxiety.         Marland Kitchen amLODipine (NORVASC) 5 MG tablet   Oral   Take 5 mg by mouth daily.         Marland Kitchen aspirin 81 MG tablet   Oral   Take 81 mg by mouth at bedtime.          . carbidopa-levodopa (SINEMET IR) 25-100 MG per tablet   Oral   Take 0.5 tablets by mouth 3 (three) times daily.         . Cholecalciferol (VITAMIN D3) 1000 UNITS CAPS   Oral   Take 2,000 capsules by mouth every morning.          . enalapril-hydrochlorothiazide (VASERETIC) 10-25 MG per tablet   Oral   Take 1 tablet by mouth daily.         Marland Kitchen escitalopram (LEXAPRO) 10 MG tablet   Oral   Take 10 mg by mouth daily.         . meloxicam (MOBIC) 15 MG tablet   Oral   Take 7.5 mg by mouth daily.         Marland Kitchen  Pitavastatin Calcium (LIVALO) 4 MG TABS   Oral   Take 1 tablet (4 mg total) by mouth at bedtime.   30 tablet   2   . QUEtiapine (SEROQUEL) 100 MG tablet   Oral   Take 100 mg by mouth daily.         . rivastigmine (EXELON) 6 MG capsule   Oral   Take 6 mg by mouth 2 (two) times daily.         . traMADol (ULTRAM) 50 MG tablet   Oral   Take 1 tablet (50 mg total) by mouth every 6 (six) hours as needed. For pain   30 tablet   0    BP 121/87  Pulse 79  Temp(Src) 97.4 F (36.3 C) (Oral)  Resp 18  SpO2 95% Physical Exam  Nursing note and vitals reviewed. Constitutional: He appears well-developed and well-nourished.  HENT:  Head: Normocephalic and atraumatic.  Eyes: EOM are normal. Pupils are equal, round, and reactive to light.  Neck: Normal range of motion.  Cardiovascular: Normal rate and intact distal pulses.   Pulmonary/Chest: Effort normal and breath sounds normal. No respiratory distress. He  has no wheezes. He has no rales.  Abdominal: Soft. He exhibits no distension. There is no rebound and no guarding.  Musculoskeletal: Normal range of motion. He exhibits edema.  On thorough secondary examination patient with no visual signs of trauma deformities or injuries.  Neurological: He is disoriented. No cranial nerve deficit. Coordination abnormal. GCS eye subscore is 4. GCS verbal subscore is 3. GCS motor subscore is 5.  Patient with history of Parkinson's. Exam consistent. Patient with tremors bilaterally worse on the right. Patient with difficulty understanding instructions. Does appear to have a more left-sided weakness. However unable to well assessed secondary to patient's condition and inability to follow instructions. Patient is moving all extremities, sensation intact throughout  Skin: Skin is warm and dry. No rash noted.  Psychiatric: He has a normal mood and affect. His behavior is normal. Judgment and thought content normal.    ED Course  Procedures (including critical care time) Labs Review Labs Reviewed  COMPREHENSIVE METABOLIC PANEL - Abnormal; Notable for the following:    Potassium 2.9 (*)    BUN 28 (*)    GFR calc non Af Amer 64 (*)    GFR calc Af Amer 74 (*)    All other components within normal limits  CBC WITH DIFFERENTIAL  URINALYSIS, ROUTINE W REFLEX MICROSCOPIC   Imaging Review Ct Head Wo Contrast  08/05/2013   CLINICAL DATA:  Weakness, confusion  EXAM: CT HEAD WITHOUT CONTRAST  TECHNIQUE: Contiguous axial images were obtained from the base of the skull through the vertex without contrast.  COMPARISON:  01/14/2011  FINDINGS: Diffuse brain atrophy noted with minor white matter microvascular ischemic changes. Slight ventricular enlargement. No acute intracranial hemorrhage, infarction, mass lesion, midline shift, herniation, hydrocephalus, or extra-axial fluid collection. No definite cerebellar abnormality. Cisterns are patent. Mastoids and sinuses clear. No  skull abnormality.  IMPRESSION: Mild brain atrophy.  No acute finding by noncontrast CT.   Electronically Signed   By: Ruel Favors M.D.   On: 08/05/2013 18:33   Dg Chest Portable 1 View  08/05/2013   CLINICAL DATA:  Weakness, fall, hypertension  EXAM: PORTABLE CHEST - 1 VIEW  COMPARISON:  08/01/2013  FINDINGS: Low lung volumes. No focal pneumonia, collapse or consolidation. No edema, pleural fluid or pneumothorax. Normal heart size for poor inspiration. Degenerative changes of the spine  and shoulders. Trachea is midline.  IMPRESSION: Low volume exam without acute process   Electronically Signed   By: Ruel Favors M.D.   On: 08/05/2013 18:44    EKG Interpretation   None       MDM   1. Fatigue   2. Parkinson disease     Afebrile vital signs stable on arrival. Patient with history Lewy body dementia and Parkinson's disease. Patient with signs and symptoms consistent with these diseases. Family complaining of shuffling gait and concern for falls. Patient had no true falls. CT head obtained to ensure no significant intracranial pathology causing worsening. This was within normal limits. Infectious workup showed no signs of UTI, pneumonia cellulitis or other infectious sources. CMP within normal limits with exception of hypokalemia. Potassium repleted in ED. Not anemic. Had long discussion family regarding goals care. Discussed patient would benefit from placement and facility with 24-hour care. However patient and family have already discussed and would like to keep at home. No home health had been set up. Will set up consult for tomorrow morning for home health. Otherwise patient appears at baseline with no need for inpatient treatment. Patient will followup with PCP tomorrow at noon. Recommend keep this. Made stable no further acute issues until discharge.   Bridgett Larsson, MD 08/05/13 2106

## 2013-08-05 NOTE — ED Notes (Signed)
Consulted with Dr. Silverio Lay for labs. Verbal orders received and ordered.

## 2013-08-05 NOTE — ED Notes (Signed)
Bladder scan completed. 97 ml scanned while MD at the bedside.

## 2013-08-05 NOTE — ED Provider Notes (Addendum)
I saw and evaluated the patient, reviewed the resident's note and I agree with the findings and plan.  EKG Interpretation   None       Date: 08/05/2013  Rate: 73  Rhythm: normal sinus rhythm  QRS Axis: normal  Intervals: normal  ST/T Wave abnormalities: nonspecific ST changes  Conduction Disutrbances:none  Narrative Interpretation: + PVC  Old EKG Reviewed: unchanged    Lynne Righi is a 71 y.o. male hx of parkinson's disease, lewy body dementia, here with worsening weakness. Worsening weakness and worsening ataxia for the last several days. He has been falling more frequently. CT showed no bleed. Labs showed K 2.9, likely from dec PO intake, and was supplemented. UA and CXR unremarkable. I discussed with wife at length regarding goals of care. She doesn't want patient to go to nursing home. We consulted case management and order home care and PT eval. He has no indication for admission at this point. Case management will f/u with patient.    Richardean Canal, MD 08/05/13 4098  Richardean Canal, MD 08/05/13 [id number]

## 2013-08-06 ENCOUNTER — Ambulatory Visit: Payer: Self-pay | Admitting: Neurology

## 2013-08-06 ENCOUNTER — Other Ambulatory Visit: Payer: Self-pay | Admitting: *Deleted

## 2013-08-06 NOTE — Telephone Encounter (Signed)
Last filled 06/30/13, last seen 08/01/13. Route to pool B , call into [facility]

## 2013-08-07 MED ORDER — QUETIAPINE FUMARATE 100 MG PO TABS: 100.0000 mg | ORAL_TABLET | Freq: Three times a day (TID) | ORAL | Status: DC

## 2013-08-07 MED ORDER — ALPRAZOLAM 0.25 MG PO TABS: 0.1250 mg | ORAL_TABLET | Freq: Every day | ORAL | Status: DC | PRN

## 2013-08-07 NOTE — Addendum Note (Signed)
Addended by: Stephanie Acre on: 08/07/2013 06:56 PM   Modules accepted: Orders

## 2013-08-07 NOTE — Telephone Encounter (Signed)
Patient's wife left message about patient going to the ED on 08-05-13 thinking he had a stroke.  They said it wasn't a stroke but think it may be due his dementia.  She would like to speak to doctor about it.  [phone].

## 2013-08-07 NOTE — Telephone Encounter (Signed)
I called the patient. I talked with the wife. The patient has had a significant change in the mental status recently. The patient is on Seroquel, currently taking 100 mg 3 times daily. The patient has become more stiff, has not been walking well. The patient continues to be confused throughout the day. The patient went to the hospital, but they did not admit him. The patient had hypokalemia. He Is not eating and drinking well. I'll try again in the office to reevaluate him. The patient likely will need an extended care facility.

## 2013-08-08 ENCOUNTER — Telehealth: Payer: Self-pay | Admitting: *Deleted

## 2013-08-08 NOTE — Telephone Encounter (Signed)
Spoke with patients wife and she says she will call back when she feels she is able to bring him in. I offered an appointment for Friday 08/09/13.

## 2013-08-09 NOTE — Telephone Encounter (Signed)
Called in to [facility] 

## 2013-08-09 NOTE — Telephone Encounter (Signed)
Appt scheduled for 08/12/13

## 2013-08-09 NOTE — Care Management Note (Signed)
    Page 1 of 2   08/06/2013     10:36:29 AM   CARE MANAGEMENT NOTE 08/06/2013  Patient:  Joseph Rice, Joseph Rice   Account Number:  [id number]  Date Initiated:  08/05/2013  Documentation initiated by:  Roseanne Reno  Subjective/Objective Assessment:   Parkinson's Disease, unstable gait and weakness.     Action/Plan:   HHC PT eval and treat and HHRN.   Anticipated DC Date:  08/05/2013   Anticipated DC Plan:  HOME W HOME HEALTH SERVICES      DC Planning Services  CM consult      [facility] Choice  HOME HEALTH   Choice offered to / List presented to:  C-3 Spouse        HH arranged  HH-1 RN  HH-2 PT      [facility] agency  [facility]   Status of service:  Completed, signed off  Discharge Disposition:  HOME W HOME HEALTH SERVICES  Comments:  08/06/13  1015a Called and spoke w/ pt's wife Joseph Rice, discussed HHC agencies, choice offered, no preference, referral to [facility]. Expalined that [facility] would be calling her to arrange a time for the visits.  Joseph Rice very appreciative of help. Stated that this was coming on very fast and that she might need a hospital bed.  I encouraged her to let [facility] know this and they could have that arranged.  Referral made to [facility] at [facility].  Joseph Rice   [phone]    08/06/13  750a - Late Entry - On call:  08/05/13 820p - Received call from [facility] ED, spoke w/ Dr. Arlie Solomons who wants to have HHC see this pt.  Pt has Parkinson's Disease, unstable gait and weakness.  Spoke w/ the pt's wife - Joseph Rice, discussed HHC and having a Charity fundraiser and PT coming to the home and she is in agreement and had one request - that the providers that come into the home be white as the pt is afraid of African American's due to his dementia.  I will make that request when I make the referral.  I will follow up on agencies are in network and then call the pt's wife back in the am.  TJohnson, RNBSN   [phone]

## 2013-08-12 ENCOUNTER — Encounter: Payer: Self-pay | Admitting: Neurology

## 2013-08-12 ENCOUNTER — Ambulatory Visit (INDEPENDENT_AMBULATORY_CARE_PROVIDER_SITE_OTHER): Payer: Medicare HMO | Admitting: Neurology

## 2013-08-12 VITALS — BP 151/83 | HR 90 | Wt 196.0 lb

## 2013-08-12 DIAGNOSIS — R413 Other amnesia: Secondary | ICD-10-CM

## 2013-08-12 DIAGNOSIS — G20 Parkinson's disease: Secondary | ICD-10-CM

## 2013-08-12 DIAGNOSIS — E876 Hypokalemia: Secondary | ICD-10-CM

## 2013-08-12 MED ORDER — MIRTAZAPINE 15 MG PO TABS: 15.0000 mg | ORAL_TABLET | Freq: Every day | ORAL | Status: DC

## 2013-08-12 MED ORDER — QUETIAPINE FUMARATE 50 MG PO TABS: ORAL_TABLET | ORAL | Status: DC

## 2013-08-12 NOTE — Patient Instructions (Signed)

## 2013-08-12 NOTE — Progress Notes (Signed)
Reason for visit: Parkinson's disease  Joseph Rice is an 72 y.o. male  History of present illness:  Joseph Rice is a 72 year old right-handed gentleman with a history of Parkinson's disease with a progressive dementia. The patient is felt possibly to have a Lewy body dementia. The patient recently has had a significant downturn in his functional ability with increased agitation, confusion, and decreased mobility. The patient went to the emergency room, and a CT scan of the brain was done that was unremarkable. This was done on 08/06/2013. The patient was found to have hypokalemia with a potassium of 2.9. The patient was felt to be dehydrated. The patient was given IV fluids, and potassium supplementation. The wife indicates that his mobility and functional level has significantly improved since this time. The patient still has ongoing confusion, not recognizing his home as his own at times. The patient will sundown beginning around 5 PM. The patient has difficulty sleeping at night with vivid dreams, and agitation. The patient is on Lexapro taking 10 mg daily, and he is on Seroquel taking 50 mg in the morning if needed, and 100 mg in the evening. The patient returns to this office for an evaluation. The patient has not had any problems with swallowing, but he has had 2 recent falls without injury. Tremors are prominent.  Past Medical History  Diagnosis Date  . Other specified disorder of penis   . Other and unspecified hyperlipidemia   . Pain in joint, shoulder region   . Other chest pain   . Dry eyes   . Inguinal hernia   . Cataract   . Hypertension   . Parkinson's disease   . Cancer   . Memory disturbance 01/07/2013  . Dementia     Past Surgical History  Procedure Laterality Date  . Back surgery    . Cataract extraction, bilateral    . Shoulder surgery Left   . Lumbar spine surgery      Family History  Problem Relation Age of Onset  . Cancer Mother   . Heart disease  Father   . Heart attack Father     Social history:  reports that he has never smoked. He has never used smokeless tobacco. He reports that he does not drink alcohol or use illicit drugs.    Allergies  Allergen Reactions  . Aricept [Donepezil Hydrochloride] Other (See Comments)    unknown  . Lipitor [Atorvastatin Calcium]     abd pain  . Lovaza [Omega-3-Acid Ethyl Esters] Other (See Comments)    Could not swallow  . Morphine And Related     confusion  . Niacin-Simvastatin Er     aching  . Penicillins Rash    Medications:  Current Outpatient Prescriptions on File Prior to Visit  Medication Sig Dispense Refill  . ALPRAZolam (XANAX) 0.25 MG tablet Take 0.5 tablets (0.125 mg total) by mouth daily as needed for anxiety.  30 tablet  2  . amLODipine (NORVASC) 5 MG tablet Take 5 mg by mouth daily.      Marland Kitchen aspirin 81 MG tablet Take 81 mg by mouth at bedtime.       . carbidopa-levodopa (SINEMET IR) 25-100 MG per tablet Take 0.5 tablets by mouth 3 (three) times daily.      . Cholecalciferol (VITAMIN D3) 1000 UNITS CAPS Take 2,000 capsules by mouth every morning.       . enalapril-hydrochlorothiazide (VASERETIC) 10-25 MG per tablet Take 1 tablet by mouth daily.      Marland Kitchen  meloxicam (MOBIC) 15 MG tablet Take 7.5 mg by mouth daily.      . Pitavastatin Calcium (LIVALO) 4 MG TABS Take 1 tablet (4 mg total) by mouth at bedtime.  30 tablet  2  . rivastigmine (EXELON) 6 MG capsule Take 6 mg by mouth 2 (two) times daily.      . traMADol (ULTRAM) 50 MG tablet Take 1 tablet (50 mg total) by mouth every 6 (six) hours as needed. For pain  30 tablet  0   No current facility-administered medications on file prior to visit.    ROS:  Out of a complete 14 system review of symptoms, the patient complains only of the following symptoms, and all other reviewed systems are negative.  Restless legs, insomnia Sleep talking, acting out dreams Tremor Memory problems, confusion  Blood pressure 151/83, pulse 90,  weight 196 lb (88.905 kg).  Physical Exam  General: The patient is alert and cooperative at the time of the examination.  Skin: No significant peripheral edema is noted.   Neurologic Exam  Mental status: The Mini-Mental status examination done today shows a total score of 15/30.  Cranial nerves: Facial symmetry is present. Speech is normal, no aphasia or dysarthria is noted. Extraocular movements are full. Visual fields are full. Masking of the face is noted.  Motor: The patient has good strength in all 4 extremities.  Sensory examination: Soft touch sensation on the face, arms, and legs is symmetric.  Coordination: The patient has good finger-nose-finger and heel-to-shin bilaterally. The patient has some apraxia with the use of the extremities. Prominent resting tremors are seen in both upper extremities.  Gait and station: The patient has a slightly stooped posture, tremors are seen while walking bilaterally. The patient otherwise has good stride, good turns. The patient is able to arise from a seated position with the arms crossed. Tandem gait is minimally unsteady. Romberg is negative. No drift is seen.  Reflexes: Deep tendon reflexes are symmetric.   Assessment/Plan:  One. Parkinson's disease versus Lewy body dementia  2. History of hypokalemia  The patient is on a blood pressure medication with a thiazide diuretic. This may be an etiology of the recent hypokalemia and dehydration. The patient will be sent for a BMET today to recheck the potassium. The patient is on a maximum dose of the Seroquel for Parkinson's patient currently. The patient will be switched from Lexapro to mirtazapine, and he will take it mirtazapine at night for sleep. We will start at 15 mg dose, but this is not effective, the wife will contact our office the patient will followup in 3-4 months.  Marlan Palau MD 08/12/2013 7:51 PM  [facility] [address] [place], [place] [address]  Phone [phone] Fax [phone]

## 2013-08-13 ENCOUNTER — Telehealth: Payer: Self-pay | Admitting: Neurology

## 2013-08-13 LAB — BASIC METABOLIC PANEL
BUN/Creatinine Ratio: 15 (ref 10–22)
CO2: 26 mmol/L (ref 18–29)
Calcium: 9.9 mg/dL (ref 8.6–10.2)
Chloride: 96 mmol/L — ABNORMAL LOW (ref 97–108)
Creatinine, Ser: 1.28 mg/dL — ABNORMAL HIGH (ref 0.76–1.27)
GFR calc Af Amer: 63 mL/min/{1.73_m2} (ref >59)
GFR calc non Af Amer: 55 mL/min/{1.73_m2} — ABNORMAL LOW (ref >59)
Potassium: 3.5 mmol/L (ref 3.5–5.2)
Sodium: 143 mmol/L (ref 134–144)

## 2013-08-13 NOTE — Telephone Encounter (Signed)
I called the patient, talked with the wife. The potassium level is normal. The renal function is still abnormal, the patient could still have some dehydration issues. I will for the blood work onto the primary care physician.

## 2013-08-16 ENCOUNTER — Ambulatory Visit (INDEPENDENT_AMBULATORY_CARE_PROVIDER_SITE_OTHER): Payer: Medicare HMO

## 2013-08-16 DIAGNOSIS — Z23 Encounter for immunization: Secondary | ICD-10-CM

## 2013-08-23 ENCOUNTER — Telehealth: Payer: Self-pay | Admitting: Neurology

## 2013-08-23 MED ORDER — ALPRAZOLAM 1 MG PO TABS: 1.0000 mg | ORAL_TABLET | Freq: Every evening | ORAL | Status: DC | PRN

## 2013-08-23 NOTE — Telephone Encounter (Signed)
I called the patient's wife. The patient continues to have issues with sleeping at night. The patient is getting 100 mg of Seroquel at night, and he got 1 mg of alprazolam last night, Benadryl, and 15 mg of mirtazapine. The patient was still having trouble getting to sleep. I will call in the 1 mg tablets of alprazolam, and go up to 30 mg of the mirtazapine. The wife will hold the Benadryl.

## 2013-08-23 NOTE — Telephone Encounter (Signed)
Pt was seen last Monday and put him on a new medication but she is unable to get him to sleep at night now. She has tried to give him different meds to put him to sleep but it is not working. Please prescribe something to help with this without assistance she feels she will not be able to handle this

## 2013-08-26 ENCOUNTER — Emergency Department (HOSPITAL_COMMUNITY): Payer: Medicare HMO

## 2013-08-26 ENCOUNTER — Encounter (HOSPITAL_COMMUNITY): Payer: Self-pay | Admitting: Emergency Medicine

## 2013-08-26 ENCOUNTER — Ambulatory Visit: Payer: Self-pay | Admitting: Family Medicine

## 2013-08-26 ENCOUNTER — Inpatient Hospital Stay (HOSPITAL_COMMUNITY)
Admission: EM | Admit: 2013-08-26 | Discharge: 2013-09-01 | DRG: 071 | Disposition: A | Discharge status: Home / Self Care | Payer: Medicare HMO | Attending: Internal Medicine | Admitting: Internal Medicine

## 2013-08-26 DIAGNOSIS — R531 Weakness: Secondary | ICD-10-CM

## 2013-08-26 DIAGNOSIS — E860 Dehydration: Secondary | ICD-10-CM | POA: Diagnosis present

## 2013-08-26 DIAGNOSIS — Z87898 Personal history of other specified conditions: Secondary | ICD-10-CM

## 2013-08-26 DIAGNOSIS — F0390 Unspecified dementia without behavioral disturbance: Secondary | ICD-10-CM | POA: Diagnosis present

## 2013-08-26 DIAGNOSIS — E876 Hypokalemia: Secondary | ICD-10-CM | POA: Diagnosis present

## 2013-08-26 DIAGNOSIS — N179 Acute kidney failure, unspecified: Secondary | ICD-10-CM

## 2013-08-26 DIAGNOSIS — F079 Unspecified personality and behavioral disorder due to known physiological condition: Secondary | ICD-10-CM

## 2013-08-26 DIAGNOSIS — R413 Other amnesia: Secondary | ICD-10-CM

## 2013-08-26 DIAGNOSIS — Z8679 Personal history of other diseases of the circulatory system: Secondary | ICD-10-CM

## 2013-08-26 DIAGNOSIS — R627 Adult failure to thrive: Secondary | ICD-10-CM | POA: Diagnosis present

## 2013-08-26 DIAGNOSIS — N138 Other obstructive and reflux uropathy: Secondary | ICD-10-CM | POA: Diagnosis present

## 2013-08-26 DIAGNOSIS — C4490 Unspecified malignant neoplasm of skin, unspecified: Secondary | ICD-10-CM

## 2013-08-26 DIAGNOSIS — E538 Deficiency of other specified B group vitamins: Secondary | ICD-10-CM | POA: Diagnosis present

## 2013-08-26 DIAGNOSIS — G20 Parkinson's disease: Secondary | ICD-10-CM | POA: Diagnosis present

## 2013-08-26 DIAGNOSIS — N401 Enlarged prostate with lower urinary tract symptoms: Secondary | ICD-10-CM | POA: Diagnosis present

## 2013-08-26 DIAGNOSIS — E870 Hyperosmolality and hypernatremia: Secondary | ICD-10-CM | POA: Diagnosis not present

## 2013-08-26 DIAGNOSIS — I10 Essential (primary) hypertension: Secondary | ICD-10-CM | POA: Diagnosis present

## 2013-08-26 DIAGNOSIS — Z66 Do not resuscitate: Secondary | ICD-10-CM | POA: Diagnosis present

## 2013-08-26 DIAGNOSIS — N390 Urinary tract infection, site not specified: Secondary | ICD-10-CM

## 2013-08-26 DIAGNOSIS — G9340 Encephalopathy, unspecified: Principal | ICD-10-CM | POA: Diagnosis present

## 2013-08-26 DIAGNOSIS — G20A1 Parkinson's disease without dyskinesia, without mention of fluctuations: Secondary | ICD-10-CM | POA: Diagnosis present

## 2013-08-26 DIAGNOSIS — M25512 Pain in left shoulder: Secondary | ICD-10-CM

## 2013-08-26 DIAGNOSIS — R1311 Dysphagia, oral phase: Secondary | ICD-10-CM | POA: Diagnosis present

## 2013-08-26 DIAGNOSIS — Z79899 Other long term (current) drug therapy: Secondary | ICD-10-CM

## 2013-08-26 DIAGNOSIS — R0789 Other chest pain: Secondary | ICD-10-CM

## 2013-08-26 DIAGNOSIS — N4889 Other specified disorders of penis: Secondary | ICD-10-CM

## 2013-08-26 DIAGNOSIS — R339 Retention of urine, unspecified: Secondary | ICD-10-CM

## 2013-08-26 DIAGNOSIS — R3129 Other microscopic hematuria: Secondary | ICD-10-CM | POA: Diagnosis present

## 2013-08-26 DIAGNOSIS — IMO0002 Reserved for concepts with insufficient information to code with codable children: Secondary | ICD-10-CM

## 2013-08-26 DIAGNOSIS — R509 Fever, unspecified: Secondary | ICD-10-CM

## 2013-08-26 DIAGNOSIS — D126 Benign neoplasm of colon, unspecified: Secondary | ICD-10-CM

## 2013-08-26 DIAGNOSIS — R6889 Other general symptoms and signs: Secondary | ICD-10-CM

## 2013-08-26 DIAGNOSIS — D518 Other vitamin B12 deficiency anemias: Secondary | ICD-10-CM

## 2013-08-26 DIAGNOSIS — E785 Hyperlipidemia, unspecified: Secondary | ICD-10-CM

## 2013-08-26 DIAGNOSIS — R1313 Dysphagia, pharyngeal phase: Secondary | ICD-10-CM | POA: Diagnosis not present

## 2013-08-26 LAB — URINALYSIS, ROUTINE W REFLEX MICROSCOPIC
Bilirubin Urine: NEGATIVE
Glucose, UA: NEGATIVE mg/dL
Ketones, ur: NEGATIVE mg/dL
Leukocytes, UA: NEGATIVE
Nitrite: NEGATIVE
Protein, ur: NEGATIVE mg/dL
Specific Gravity, Urine: 1.015 (ref 1.005–1.030)
Urobilinogen, UA: 1 mg/dL (ref 0.0–1.0)

## 2013-08-26 LAB — CBC
HCT: 47.4 % (ref 39.0–52.0)
Hemoglobin: 16.6 g/dL (ref 13.0–17.0)
MCHC: 35 g/dL (ref 30.0–36.0)
Platelets: 182 10*3/uL (ref 150–400)
RDW: 13 % (ref 11.5–15.5)

## 2013-08-26 LAB — COMPREHENSIVE METABOLIC PANEL
ALT: 9 U/L (ref 0–53)
AST: 14 U/L (ref 0–37)
Albumin: 3.6 g/dL (ref 3.5–5.2)
BUN: 15 mg/dL (ref 6–23)
CO2: 29 mEq/L (ref 19–32)
Chloride: 99 mEq/L (ref 96–112)
Creatinine, Ser: 1.03 mg/dL (ref 0.50–1.35)
GFR calc Af Amer: 81 mL/min — ABNORMAL LOW (ref >90)
GFR calc non Af Amer: 69 mL/min — ABNORMAL LOW (ref >90)
Glucose, Bld: 113 mg/dL — ABNORMAL HIGH (ref 70–99)
Total Bilirubin: 1.1 mg/dL (ref 0.3–1.2)

## 2013-08-26 LAB — CBC WITH DIFFERENTIAL/PLATELET
Basophils Absolute: 0 10*3/uL (ref 0.0–0.1)
Eosinophils Absolute: 0 10*3/uL (ref 0.0–0.7)
Eosinophils Relative: 0 % (ref 0–5)
HCT: 50.9 % (ref 39.0–52.0)
Lymphocytes Relative: 9 % — ABNORMAL LOW (ref 12–46)
MCHC: 35.2 g/dL (ref 30.0–36.0)
MCV: 92.2 fL (ref 78.0–100.0)
Neutrophils Relative %: 81 % — ABNORMAL HIGH (ref 43–77)
Platelets: 184 10*3/uL (ref 150–400)
RDW: 12.8 % (ref 11.5–15.5)
WBC: 10.1 10*3/uL (ref 4.0–10.5)

## 2013-08-26 LAB — CG4 I-STAT (LACTIC ACID): Lactic Acid, Venous: 1.53 mmol/L (ref 0.5–2.2)

## 2013-08-26 LAB — CREATININE, SERUM
Creatinine, Ser: 1.12 mg/dL (ref 0.50–1.35)
GFR calc non Af Amer: 63 mL/min — ABNORMAL LOW (ref >90)

## 2013-08-26 LAB — TROPONIN I: Troponin I: <0.3 ng/mL (ref <0.30)

## 2013-08-26 LAB — URINE MICROSCOPIC-ADD ON

## 2013-08-26 LAB — LIPASE, BLOOD: Lipase: 20 U/L (ref 11–59)

## 2013-08-26 LAB — MAGNESIUM: Magnesium: 1.7 mg/dL (ref 1.5–2.5)

## 2013-08-26 MED ORDER — ENOXAPARIN SODIUM 40 MG/0.4ML ~~LOC~~ SOLN
40.0000 mg | SUBCUTANEOUS | Status: DC
  Administered 2013-08-26 – 2013-08-29 (×4): 40 mg via SUBCUTANEOUS
  Filled 2013-08-26 (×6): qty 0.4

## 2013-08-26 MED ORDER — IOHEXOL 300 MG/ML  SOLN
25.0000 mL | INTRAMUSCULAR | Status: AC
  Administered 2013-08-26: 25 mL via ORAL

## 2013-08-26 MED ORDER — SODIUM CHLORIDE 0.9 % IV SOLN
INTRAVENOUS | Status: DC
  Administered 2013-08-26: 10:00:00 via INTRAVENOUS

## 2013-08-26 MED ORDER — POTASSIUM CHLORIDE IN NACL 40-0.9 MEQ/L-% IV SOLN
INTRAVENOUS | Status: DC
  Administered 2013-08-26 – 2013-08-27 (×2): via INTRAVENOUS
  Administered 2013-08-27 – 2013-08-28 (×2): 125 mL/h via INTRAVENOUS
  Filled 2013-08-26 (×10): qty 1000

## 2013-08-26 MED ORDER — HYDROMORPHONE HCL PF 1 MG/ML IJ SOLN
0.5000 mg | Freq: Once | INTRAMUSCULAR | Status: AC
  Administered 2013-08-26: 0.5 mg via INTRAVENOUS
  Filled 2013-08-26: qty 1

## 2013-08-26 MED ORDER — SODIUM CHLORIDE 0.9 % IV SOLN: INTRAVENOUS | Status: DC

## 2013-08-26 MED ORDER — CARBIDOPA-LEVODOPA 25-100 MG PO TABS
0.5000 | ORAL_TABLET | Freq: Three times a day (TID) | ORAL | Status: DC
  Administered 2013-08-28 – 2013-09-01 (×11): 0.5 via ORAL
  Filled 2013-08-26 (×19): qty 0.5

## 2013-08-26 MED ORDER — RIVASTIGMINE TARTRATE 3 MG PO CAPS
6.0000 mg | ORAL_CAPSULE | Freq: Two times a day (BID) | ORAL | Status: DC
  Filled 2013-08-26 (×5): qty 2

## 2013-08-26 MED ORDER — CIPROFLOXACIN IN D5W 400 MG/200ML IV SOLN
400.0000 mg | Freq: Once | INTRAVENOUS | Status: AC
  Administered 2013-08-26: 400 mg via INTRAVENOUS
  Filled 2013-08-26: qty 200

## 2013-08-26 MED ORDER — SODIUM CHLORIDE 0.9 % IV BOLUS (SEPSIS)
500.0000 mL | Freq: Once | INTRAVENOUS | Status: AC
  Administered 2013-08-26: 500 mL via INTRAVENOUS

## 2013-08-26 MED ORDER — IOHEXOL 300 MG/ML  SOLN
100.0000 mL | Freq: Once | INTRAMUSCULAR | Status: AC | PRN
  Administered 2013-08-26: 100 mL via INTRAVENOUS

## 2013-08-26 MED ORDER — HYDRALAZINE HCL 20 MG/ML IJ SOLN
10.0000 mg | Freq: Once | INTRAMUSCULAR | Status: DC
  Filled 2013-08-26: qty 1
  Filled 2013-08-26: qty 0.5

## 2013-08-26 MED ORDER — POTASSIUM CHLORIDE 10 MEQ/100ML IV SOLN
10.0000 meq | INTRAVENOUS | Status: AC
  Administered 2013-08-26 (×4): 10 meq via INTRAVENOUS
  Filled 2013-08-26 (×5): qty 100

## 2013-08-26 NOTE — H&P (Addendum)
[facility] History and Physical  Joseph Rice JYN:[id number] DOB: 10/31/1940 DOA: 08/26/2013 Referring physician: Deretha Emory PCP: Rudi Heap, MD   Chief Complaint: could not get out of bed for 2 days, and not eating  HPI: Joseph Rice is a 72 y.o. 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 All hx per wife and chart review. On multiple sedating meds and recently started remeron for wandering/not sleeping at night.  Also, wife was giving patient benadryl at night to keep him sedated until 3 nights ago.  Usually, able to walk on his own, goes out to lunch and shopping with wife.  Extensive Workup in ED only significant for potassium of 3.0 and microscopic hematuria.  Wife provides 24 hour care and is not interested in placement.  Review of Systems:  Unable. Patient is nonverbal  Past Medical History  Diagnosis Date  . Other specified disorder of penis   . Other and unspecified hyperlipidemia   . Pain in joint, shoulder region   . Other chest pain   . Dry eyes   . Inguinal hernia   . Cataract   . Hypertension   . Parkinson's disease   . Cancer   . Memory disturbance 01/07/2013  . Dementia    Past Surgical History  Procedure Laterality Date  . Back surgery    . Cataract extraction, bilateral    . Shoulder surgery Left   . Lumbar spine surgery     Social History:  reports that he has never smoked. He has never used smokeless tobacco. He reports that he does not drink alcohol or use illicit drugs.  Allergies  Allergen Reactions  . Aricept [Donepezil Hydrochloride] Other (See Comments)    unknown  . Lipitor [Atorvastatin Calcium]     abd pain  . Lovaza [Omega-3-Acid Ethyl Esters] Other (See Comments)    Could not swallow  . Morphine And Related     confusion  . Niacin-Simvastatin Er     aching  . Penicillins Rash    Family History  Problem Relation Age of Onset  .  Cancer Mother   . Heart disease Father   . Heart attack Father      Prior to Admission medications   Medication Sig Start Date End Date Taking? Authorizing Provider  ALPRAZolam Prudy Feeler) 1 MG tablet Take 1 tablet (1 mg total) by mouth at bedtime as needed for anxiety. 08/23/13  Yes York Spaniel, MD  amLODipine (NORVASC) 5 MG tablet Take 5 mg by mouth daily.   Yes Historical Provider, MD  aspirin 81 MG tablet Take 81 mg by mouth at bedtime.    Yes Historical Provider, MD  carbidopa-levodopa (SINEMET IR) 25-100 MG per tablet Take 0.5 tablets by mouth 3 (three) times daily.   Yes Historical Provider, MD  Cholecalciferol 2000 UNITS CAPS Take 2,000 Units by mouth daily.   Yes Historical Provider, MD  enalapril-hydrochlorothiazide (VASERETIC) 10-25 MG per tablet Take 1 tablet by mouth daily.   Yes Historical Provider, MD  meloxicam (MOBIC) 15 MG tablet Take 7.5 mg by mouth daily.   Yes Historical Provider, MD  mirtazapine (REMERON) 15 MG tablet Take 15-30 mg by mouth at bedtime.   Yes Historical Provider, MD  Pitavastatin Calcium (LIVALO) 4 MG TABS Take 1 tablet (4 mg total) by mouth at bedtime. 06/14/13  Yes Ernestina Penna, MD  QUEtiapine (SEROQUEL) 50 MG tablet Take 100 mg by mouth at bedtime.  Yes Historical Provider, MD  rivastigmine (EXELON) 6 MG capsule Take 6 mg by mouth 2 (two) times daily.   Yes Historical Provider, MD  traMADol (ULTRAM) 50 MG tablet Take 1 tablet (50 mg total) by mouth every 6 (six) hours as needed. For pain 06/14/13  Yes Ernestina Penna, MD   Physical Exam: Filed Vitals:   08/26/13 1831  BP: 134/80  Pulse: 95  Temp: 97.2 F (36.2 C)  Resp:     BP 134/80  Pulse 95  Temp(Src) 97.2 F (36.2 C) (Oral)  Resp 25  SpO2 94%  BP 134/80  Pulse 95  Temp(Src) 97.2 F (36.2 C) (Oral)  Resp 25  Ht 5\' 7"  (1.702 m)  Wt 85.458 kg (188 lb 6.4 oz)  BMI 29.50 kg/m2  SpO2 94%  General Appearance:    Somnolent male. Does not follow commands or speak  Head:     Normocephalic, without obvious abnormality, atraumatic  Eyes:    Resists eye-opening         Nose:   No drainage  Throat:   Extremely dry mucous membranes  Neck:   Supple, no LA or thyromegaly     Lungs:     Clear to auscultation bilaterally, respirations unlabored  Chest wall:    No tenderness or deformity  Heart:    Regular rate and rhythm, S1 and S2 normal, no murmur, rub   or gallop  Abdomen:     Soft, non-tender, bowel sounds active all four quadrants,    no masses, no organomegaly  Genitalia:   deferred  Rectal:   deferred  Extremities:   Extremities normal, atraumatic, no cyanosis or edema  Pulses:   2+ and symmetric all extremities  Skin:  poor turgor, no rash  Lymph nodes:   Cervical, supraclavicular, and axillary nodes normal  Neurologic:   Cogwheel rigidity. Withdraws to pain. No apparent focal defecit            Labs on Admission:  Basic Metabolic Panel:  Recent Labs Lab 08/26/13 1000  NA 141  K 3.0*  CL 99  CO2 29  GLUCOSE 113*  BUN 15  CREATININE 1.03  CALCIUM 9.3   Liver Function Tests:  Recent Labs Lab 08/26/13 1000  AST 14  ALT 9  ALKPHOS 84  BILITOT 1.1  PROT 7.3  ALBUMIN 3.6    Recent Labs Lab 08/26/13 1000  LIPASE 20   No results found for this basename: AMMONIA,  in the last 168 hours CBC:  Recent Labs Lab 08/26/13 1000  WBC 10.1  NEUTROABS 8.1*  HGB 17.9*  HCT 50.9  MCV 92.2  PLT 184   Cardiac Enzymes:  Recent Labs Lab 08/26/13 1000  TROPONINI <0.30    BNP (last 3 results)  Recent Labs  08/26/13 1000  PROBNP 74.9   CBG: No results found for this basename: GLUCAP,  in the last 168 hours  Radiological Exams on Admission:   Assessment/Plan Principal Problem:   Acute encephalopathy: likely combination of sedating medications and resulting FTT/severe dehydration. Hold all meds, hydrate and monitor.  If no improvement consider MRI, EEG. Also check TSH, B12, folate   Dehydration Active Problems:    HYPERTENSION   Parkinson's disease, possible Lewy body dementia   Hypokalemia: replete and check mag FTT: speech therapy eval PT/OT   Code Status: DNR Family Communication: *wife at bedside Disposition Plan: home  Time spent: 60 min  Lilias Lorensen L [facility] Pager [phone]

## 2013-08-26 NOTE — ED Notes (Signed)
The patient's wife called EMS due to the patient not able to get out of bed.  According to the wife, the patient went to bed on Saturday and has not been able to get out of bed.  He hasn't been wanting to eat either.  The wife says he is normally able to get out of bed with help and shuffles with some help.  He is able to walk from the bed to the recliner and he has not been able to do that.  Wife called and EMS brought him here to be evaluated.  EMS placed a 20g in the left [facility] and they gave him 150cc bolus of NS.

## 2013-08-26 NOTE — ED Notes (Signed)
Patient transported to CT 

## 2013-08-26 NOTE — ED Notes (Signed)
MD at bedside. Explaining results and discussing admission.

## 2013-08-26 NOTE — ED Notes (Signed)
Family at bedside. 

## 2013-08-26 NOTE — Progress Notes (Addendum)
Pt has bp of 140/100, asymptomatic, resting with eyes closed. No PRN bp medication ordered. Per wife, pt is very weak and cannot take medication by mouth at this time due to fear of aspiration. Paged K. Schorr. Awaiting response. Will continue to monitor. Joseph Rice

## 2013-08-26 NOTE — ED Provider Notes (Signed)
CSN: [id number]     Arrival date & time 08/26/13  4098 History   First MD Initiated Contact with Patient 08/26/13 [phone]     Chief Complaint  Patient presents with  . Weakness    Poor intake   (Consider location/radiation/quality/duration/timing/severity/associated sxs/prior Treatment) Patient is a 72 y.o. male presenting with weakness. The history is provided by the patient, the spouse and the EMS personnel.  Weakness Associated symptoms include abdominal pain. Pertinent negatives include no chest pain, no headaches and no shortness of breath.   patient with history of Alzheimer's dementia. Patient normally able to walk from bed to chair with assistance also able to walk to the shower with assistance shower himself and then assistance getting out of shower. Patient does spend a lot of time in a chair or bed. Patient has a history of Alzheimer's and also has motor weakness developing with it. Patient has not been out of bed since Saturday very poor by mouth intake has not been wanting to E. Brought in by EMS. No fever some questionable bowel pain no nausea vomiting or diarrhea. No cough.  Past Medical History  Diagnosis Date  . Other specified disorder of penis   . Other and unspecified hyperlipidemia   . Pain in joint, shoulder region   . Other chest pain   . Dry eyes   . Inguinal hernia   . Cataract   . Hypertension   . Parkinson's disease   . Cancer   . Memory disturbance 01/07/2013  . Dementia    Past Surgical History  Procedure Laterality Date  . Back surgery    . Cataract extraction, bilateral    . Shoulder surgery Left   . Lumbar spine surgery     Family History  Problem Relation Age of Onset  . Cancer Mother   . Heart disease Father   . Heart attack Father    History  Substance Use Topics  . Smoking status: Never Smoker   . Smokeless tobacco: Never Used  . Alcohol Use: No    Review of Systems  Constitutional: Positive for appetite change and fatigue. Negative  for fever and diaphoresis.  HENT: Negative for congestion.   Eyes: Negative for redness.  Respiratory: Negative for shortness of breath.   Cardiovascular: Negative for chest pain.  Gastrointestinal: Positive for abdominal pain. Negative for nausea and vomiting.       Questionable pain in the abdomen  Genitourinary: Negative for dysuria.  Skin: Negative for wound.  Neurological: Positive for weakness. Negative for headaches.  Hematological: Does not bruise/bleed easily.  Psychiatric/Behavioral: Positive for confusion and sleep disturbance.    Allergies  Aricept; Lipitor; Lovaza; Morphine and related; Niacin-simvastatin er; and Penicillins  Home Medications   Current Outpatient Rx  Name  Route  Sig  Dispense  Refill  . ALPRAZolam (XANAX) 1 MG tablet   Oral   Take 1 tablet (1 mg total) by mouth at bedtime as needed for anxiety.   30 tablet   2   . amLODipine (NORVASC) 5 MG tablet   Oral   Take 5 mg by mouth daily.         Marland Kitchen aspirin 81 MG tablet   Oral   Take 81 mg by mouth at bedtime.          . carbidopa-levodopa (SINEMET IR) 25-100 MG per tablet   Oral   Take 0.5 tablets by mouth 3 (three) times daily.         . Cholecalciferol 2000  UNITS CAPS   Oral   Take 2,000 Units by mouth daily.         . enalapril-hydrochlorothiazide (VASERETIC) 10-25 MG per tablet   Oral   Take 1 tablet by mouth daily.         . meloxicam (MOBIC) 15 MG tablet   Oral   Take 7.5 mg by mouth daily.         . mirtazapine (REMERON) 15 MG tablet   Oral   Take 15-30 mg by mouth at bedtime.         . Pitavastatin Calcium (LIVALO) 4 MG TABS   Oral   Take 1 tablet (4 mg total) by mouth at bedtime.   30 tablet   2   . QUEtiapine (SEROQUEL) 50 MG tablet   Oral   Take 100 mg by mouth at bedtime.         . rivastigmine (EXELON) 6 MG capsule   Oral   Take 6 mg by mouth 2 (two) times daily.         . traMADol (ULTRAM) 50 MG tablet   Oral   Take 1 tablet (50 mg total) by  mouth every 6 (six) hours as needed. For pain   30 tablet   0    BP 142/122  Pulse 105  Temp(Src) 97.7 F (36.5 C) (Oral)  Resp 25  SpO2 95% Physical Exam  Nursing note and vitals reviewed. Constitutional: He appears well-developed and well-nourished. No distress.  HENT:  Head: Normocephalic and atraumatic.  Mouth/Throat: Oropharynx is clear and moist.  Eyes: Conjunctivae and EOM are normal. Pupils are equal, round, and reactive to light.  Neck: Normal range of motion.  Cardiovascular: Normal rate and regular rhythm.   No murmur heard. Pulmonary/Chest: Effort normal and breath sounds normal. No respiratory distress. He has no wheezes. He has no rales.  Abdominal: Soft. Bowel sounds are normal. There is no tenderness.  Musculoskeletal: Normal range of motion. He exhibits no edema.  Neurological: He is alert.  Patient's mental status is baseline he is awake and will try to verbalize most words can be understood. Has a general tremor will move all 4 extremities.    ED Course  Procedures (including critical care time) Labs Review Labs Reviewed  COMPREHENSIVE METABOLIC PANEL - Abnormal; Notable for the following:    Potassium 3.0 (*)    Glucose, Bld 113 (*)    GFR calc non Af Amer 69 (*)    GFR calc Af Amer 81 (*)    All other components within normal limits  CBC WITH DIFFERENTIAL - Abnormal; Notable for the following:    Hemoglobin 17.9 (*)    Neutrophils Relative % 81 (*)    Neutro Abs 8.1 (*)    Lymphocytes Relative 9 (*)    All other components within normal limits  URINALYSIS, ROUTINE W REFLEX MICROSCOPIC - Abnormal; Notable for the following:    Color, Urine AMBER (*)    Hgb urine dipstick LARGE (*)    All other components within normal limits  LIPASE, BLOOD  TROPONIN I  PRO B NATRIURETIC PEPTIDE  URINE MICROSCOPIC-ADD ON  CG4 I-STAT (LACTIC ACID)   Results for orders placed during the hospital encounter of 08/26/13  COMPREHENSIVE METABOLIC PANEL      Result  Value Range   Sodium 141  135 - 145 mEq/L   Potassium 3.0 (*) 3.5 - 5.1 mEq/L   Chloride 99  96 - 112 mEq/L   CO2 29  19 - 32 mEq/L   Glucose, Bld 113 (*) 70 - 99 mg/dL   BUN 15  6 - 23 mg/dL   Creatinine, Ser 9.52  0.50 - 1.35 mg/dL   Calcium 9.3  8.4 - 84.1 mg/dL   Total Protein 7.3  6.0 - 8.3 g/dL   Albumin 3.6  3.5 - 5.2 g/dL   AST 14  0 - 37 U/L   ALT 9  0 - 53 U/L   Alkaline Phosphatase 84  39 - 117 U/L   Total Bilirubin 1.1  0.3 - 1.2 mg/dL   GFR calc non Af Amer 69 (*) >90 mL/min   GFR calc Af Amer 81 (*) >90 mL/min  LIPASE, BLOOD      Result Value Range   Lipase 20  11 - 59 U/L  CBC WITH DIFFERENTIAL      Result Value Range   WBC 10.1  4.0 - 10.5 K/uL   RBC 5.52  4.22 - 5.81 MIL/uL   Hemoglobin 17.9 (*) 13.0 - 17.0 g/dL   HCT 32.4  40.1 - 02.7 %   MCV 92.2  78.0 - 100.0 fL   MCH 32.4  26.0 - 34.0 pg   MCHC 35.2  30.0 - 36.0 g/dL   RDW 25.3  66.4 - 40.3 %   Platelets 184  150 - 400 K/uL   Neutrophils Relative % 81 (*) 43 - 77 %   Neutro Abs 8.1 (*) 1.7 - 7.7 K/uL   Lymphocytes Relative 9 (*) 12 - 46 %   Lymphs Abs 0.9  0.7 - 4.0 K/uL   Monocytes Relative 10  3 - 12 %   Monocytes Absolute 1.0  0.1 - 1.0 K/uL   Eosinophils Relative 0  0 - 5 %   Eosinophils Absolute 0.0  0.0 - 0.7 K/uL   Basophils Relative 0  0 - 1 %   Basophils Absolute 0.0  0.0 - 0.1 K/uL  TROPONIN I      Result Value Range   Troponin I <0.30  <0.30 ng/mL  PRO B NATRIURETIC PEPTIDE      Result Value Range   Pro B Natriuretic peptide (BNP) 74.9  0 - 125 pg/mL  URINALYSIS, ROUTINE W REFLEX MICROSCOPIC      Result Value Range   Color, Urine AMBER (*) YELLOW   APPearance CLEAR  CLEAR   Specific Gravity, Urine 1.015  1.005 - 1.030   pH 6.5  5.0 - 8.0   Glucose, UA NEGATIVE  NEGATIVE mg/dL   Hgb urine dipstick LARGE (*) NEGATIVE   Bilirubin Urine NEGATIVE  NEGATIVE   Ketones, ur NEGATIVE  NEGATIVE mg/dL   Protein, ur NEGATIVE  NEGATIVE mg/dL   Urobilinogen, UA 1.0  0.0 - 1.0 mg/dL   Nitrite  NEGATIVE  NEGATIVE   Leukocytes, UA NEGATIVE  NEGATIVE  URINE MICROSCOPIC-ADD ON      Result Value Range   Squamous Epithelial / LPF RARE  RARE   WBC, UA 0-2  <3 WBC/hpf   RBC / HPF 21-50  <3 RBC/hpf   Bacteria, UA RARE  RARE   Urine-Other MUCOUS PRESENT    CG4 I-STAT (LACTIC ACID)      Result Value Range   Lactic Acid, Venous 1.53  0.5 - 2.2 mmol/L    Imaging Review Dg Chest 1 View  08/26/2013   CLINICAL DATA:  72 year old male with shortness of breath. Weakness. Initial encounter.  EXAM: CHEST - 1 VIEW  COMPARISON:  08/05/2013 and earlier.  FINDINGS: Portable AP semi upright view at 1023 hrs. Mildly lower lung volumes. Stable cardiac size and mediastinal contours. Allowing for portable technique, the lungs are clear. Advanced degenerative osseous changes at both shoulders. No pneumothorax.  IMPRESSION: Low lung volumes, otherwise no acute cardiopulmonary abnormality.   Electronically Signed   By: Augusto Gamble M.D.   On: 08/26/2013 10:43   Ct Head Wo Contrast  08/26/2013   CLINICAL DATA:  Weakness. Poor oral intake. Unable to get out of bed.  EXAM: CT HEAD WITHOUT CONTRAST  TECHNIQUE: Contiguous axial images were obtained from the base of the skull through the vertex without intravenous contrast.  COMPARISON:  CT head without contrast 08/05/2013.  FINDINGS: No acute cortical infarct, hemorrhage, or mass lesion is present. Moderate generalized atrophy and white matter disease is stable. The ventricles are proportionate to the degree of atrophy. No significant extra-axial fluid collection is present.  The paranasal sinuses branch that and anterior left ethmoid air cell is opacified. Minimal mucosal thickening is noted in the left maxillary sinus. The paranasal sinuses and mastoid air cells are otherwise clear.  IMPRESSION: 1. Stable atrophy and white matter disease. 2. No acute intracranial abnormality. 3. Minimal sinus disease.   Electronically Signed   By: Gennette Pac M.D.   On: 08/26/2013  10:08   Ct Abdomen Pelvis W Contrast  08/26/2013   CLINICAL DATA:  Loss of appetite.  Weakness.  EXAM: CT ABDOMEN AND PELVIS WITH CONTRAST  TECHNIQUE: Multidetector CT imaging of the abdomen and pelvis was performed using the standard protocol following bolus administration of intravenous contrast.  CONTRAST:  OMNIPAQUE IOHEXOL 300 MG/ML  SOLN  COMPARISON:  08/05/2011  FINDINGS: There is a small amount of pleural fluid on the right with minimal dependent atelectasis. No pericardial fluid. There is extensive coronary artery calcification.  There is artifact related to the patient's law arms.  The liver appears normal. No calcified gallstones. The spleen is normal. The pancreas is normal. The adrenal glands are normal. There are a few small renal cysts but no worrisome renal lesion. No stone or obstruction. The aorta shows atherosclerosis but no aneurysm. The IVC is unremarkable. No retroperitoneal mass or adenopathy. No free intraperitoneal fluid or air. The prostate gland is markedly enlarged. The bladder shows wall thickening. There are no enlarged pelvic lymph nodes.  No primary bowel pathology is seen.  There is advanced chronic degenerative disease in the lower lumbar spine.  IMPRESSION: Small amount of pleural fluid on the right with mild dependent pulmonary atelectasis.  Extensive coronary artery calcification.  Markedly enlarged prostate gland.  No abnormal lymph nodes seen.  Degenerative disease of the spine.   Electronically Signed   By: Paulina Fusi M.D.   On: 08/26/2013 14:24    EKG Interpretation   None      Date: 08/26/2013  Rate: 97  Rhythm: normal sinus rhythm  QRS Axis: normal  Intervals: QT prolonged  ST/T Wave abnormalities: nonspecific ST/T changes  Conduction Disutrbances:nonspecific intraventricular conduction delay  Narrative Interpretation:   Old EKG Reviewed: changes noted Today's EKG officially read by the computer as atrial flutter but not so sure this is water  that may predominately be artifact due to his tremors. Compared to EKG from 08/05/2013 similar other than that today's rates a little faster.   MDM   1. UTI (lower urinary tract infection)   2. Weakness   3. Dehydration   4. Failure to thrive    Patient will require admission for  failure to thrive dehydration weakness. Patient has Alzheimer's specific type Lewibodies followed by [facility] neurology. Patient has not been out of bed since Saturday. Very poor by mouth intake. Workup here done including CT of abdomen without a lot of specific findings. A little bit low potassium, hemoglobin hematocrit is concentrated urinalysis has some hematuria which could be signs of a urinary tract infection but no significant white blood cells. Troponin is negative chest x-rays negative for pneumonia or congestive heart failure. Lactic acid is not elevated no evidence of a septic picture. Patient's creatinine is normal and BUN is normal. Patient started on Cipro in case of a urinary tract infection given IV fluids. May require some potassium supplements. Will be admitted by the hospitalist team.    Shelda Jakes, MD 08/26/13 [phone]

## 2013-08-26 NOTE — ED Notes (Signed)
Pt. To be admitted. Discussed the signs and symptoms of a UTI. BP addressed with Dr. Herma Carson. No new orders.

## 2013-08-26 NOTE — Progress Notes (Signed)
Patient arrived to unit with wife. Patient was arousable. Patient comfortable in the bed. MD at bedside.

## 2013-08-26 NOTE — ED Notes (Signed)
Patient transported to X-ray 

## 2013-08-27 ENCOUNTER — Inpatient Hospital Stay (HOSPITAL_COMMUNITY): Payer: Medicare HMO

## 2013-08-27 DIAGNOSIS — R5381 Other malaise: Secondary | ICD-10-CM

## 2013-08-27 DIAGNOSIS — D518 Other vitamin B12 deficiency anemias: Secondary | ICD-10-CM

## 2013-08-27 DIAGNOSIS — R627 Adult failure to thrive: Secondary | ICD-10-CM

## 2013-08-27 LAB — VITAMIN B12: Vitamin B-12: 345 pg/mL (ref 211–911)

## 2013-08-27 LAB — FOLATE RBC: RBC Folate: 446 ng/mL (ref >280)

## 2013-08-27 MED ORDER — HALOPERIDOL LACTATE 5 MG/ML IJ SOLN
2.5000 mg | Freq: Once | INTRAMUSCULAR | Status: DC
  Filled 2013-08-27: qty 1

## 2013-08-27 MED ORDER — HALOPERIDOL LACTATE 5 MG/ML IJ SOLN: 2.5000 mg | Freq: Four times a day (QID) | INTRAMUSCULAR | Status: DC | PRN

## 2013-08-27 MED ORDER — MAGNESIUM SULFATE IN D5W 10-5 MG/ML-% IV SOLN
1.0000 g | Freq: Once | INTRAVENOUS | Status: AC
  Administered 2013-08-27: 1 g via INTRAVENOUS
  Filled 2013-08-27: qty 100

## 2013-08-27 MED ORDER — FLEET ENEMA 7-19 GM/118ML RE ENEM
1.0000 | ENEMA | Freq: Once | RECTAL | Status: AC
  Administered 2013-08-27: 1 via RECTAL
  Filled 2013-08-27: qty 1

## 2013-08-27 NOTE — Evaluation (Signed)
Clinical/Bedside Swallow Evaluation Patient Details  Name: Joseph Rice MRN: [id number] Date of Birth: 1941/07/21  Today's Date: 08/27/2013 Time: 1341-1400 SLP Time Calculation (min): 19 min  Past Medical History:  Past Medical History  Diagnosis Date  . Other specified disorder of penis   . Other and unspecified hyperlipidemia   . Pain in joint, shoulder region   . Other chest pain   . Dry eyes   . Inguinal hernia   . Cataract   . Hypertension   . Parkinson's disease   . Cancer   . Memory disturbance 01/07/2013  . Dementia    Past Surgical History:  Past Surgical History  Procedure Laterality Date  . Back surgery    . Cataract extraction, bilateral    . Shoulder surgery Left   . Lumbar spine surgery     HPI:  Joseph Rice is a 72 y.o. 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 All hx per wife and chart review. On multiple sedating meds and recently started remeron for wandering/not sleeping at night. Also, wife was giving patient benadryl at night to keep him sedated until 3 nights ago. Usually, able to walk on his own, goes out to lunch and shopping with wife. Extensive Workup in ED only significant for potassium of 3.0 and microscopic hematuria. Wife provides 24 hour care and is not interested in placement. Wife denies any h/o dysphagia and PNA.   Assessment / Plan / Recommendation Clinical Impression  Pt presents with immediate and delayed coughing throughout PO trials with all consistencies tested. Wife denies h/o swallowing difficulty until this previous Sunday, when he started coughing with PO intake. Given the above, pt's h/o Parkinsons, and current weakness, recommend to complete objective testing prior to initiating PO diet. Skilled intervention focused on education with pt, wife, and family members regarding results and recommendations, as well as the risks of aspiration.     Aspiration Risk  Moderate    Diet Recommendation NPO   Medication Administration: Via alternative means    Other  Recommendations Recommended Consults: MBS Oral Care Recommendations: Oral care Q4 per protocol   Follow Up Recommendations  Other (comment) (TBD pending objective testing)    Frequency and Duration        Pertinent Vitals/Pain N/A    SLP Swallow Goals  TBD pending objective testing   Swallow Study Prior Functional Status  Type of Home: House Available Help at Discharge: Family;Available 24 hours/day    General Date of Onset: 08/26/13 HPI: Joseph Rice is a 72 y.o. 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 All hx per wife and chart review. On multiple sedating meds and recently started remeron for wandering/not sleeping at night. Also, wife was giving patient benadryl at night to keep him sedated until 3 nights ago. Usually, able to walk on his own, goes out to lunch and shopping with wife. Extensive Workup in ED only significant for potassium of 3.0 and microscopic hematuria. Wife provides 24 hour care and is not interested in placement. Wife denies any h/o dysphagia and PNA. Type of Study: Bedside swallow evaluation Previous Swallow Assessment: none in chart Diet Prior to this Study: NPO Temperature Spikes Noted: No Respiratory Status: Room air History of Recent Intubation: No Behavior/Cognition: Alert;Confused;Requires cueing Oral Cavity - Dentition: Adequate natural dentition Self-Feeding Abilities: Needs assist Patient Positioning: Upright in bed  Baseline Vocal Quality: Low vocal intensity (suspect secondary to PD) Volitional Cough: Cognitively unable to elicit    Oral/Motor/Sensory Function Overall Oral Motor/Sensory Function: Other (comment) (unable to follow commands;lingual fasciculations noted @rest )   Ice Chips Ice chips: Not tested   Thin Liquid Thin  Liquid: Impaired Presentation: Cup;Self Fed;Straw Oral Phase Impairments: Poor awareness of bolus Oral Phase Functional Implications: Oral holding Pharyngeal  Phase Impairments: Suspected delayed Swallow;Cough - Immediate    Nectar Thick Nectar Thick Liquid: Not tested   Honey Thick Honey Thick Liquid: Not tested   Puree Puree: Impaired Presentation: Spoon Oral Phase Impairments: Poor awareness of bolus Pharyngeal Phase Impairments: Suspected delayed Swallow;Cough - Delayed   Solid   GO    Solid: Impaired Oral Phase Impairments: Other (comment);Poor awareness of bolus (impaired mastication) Pharyngeal Phase Impairments: Suspected delayed Swallow;Cough - Immediate        Maxcine Ham, M.A. CCC-SLP [phone]  Maxcine Ham 08/27/2013,2:33 PM

## 2013-08-27 NOTE — Evaluation (Signed)
Physical Therapy Evaluation Patient Details Name: Joseph Rice MRN: [id number] DOB: 03/10/1941 Today's Date: 08/27/2013 Time: 0930-1004 PT Time Calculation (min): 34 min  PT Assessment / Plan / Recommendation History of Present Illness  Joseph Rice is a 72 y.o. 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 All hx per wife and chart review. On multiple sedating meds and recently started remeron for wandering/not sleeping at night. Also, wife was giving patient benadryl at night to keep him sedated until 3 nights ago. Usually, able to walk on his own, goes out to lunch and shopping with wife. Extensive Workup in ED only significant for potassium of 3.0 and microscopic hematuria. Wife provides 24 hour care and is not interested in placement.   Clinical Impression  Pt adm due to the above. Presents with decreased independence with funtional mobility secondary to deficits indicated below. Prior to admission pt was ambulating without AD throughout house and within the community. Pt to benefit from skilled PT in acute setting to maximize functional independence with mobility and address deficits listed below (see PT problem list). Wife is adamant on taking pt home and providing 24/7 (A) for pt. Pt and wife to benefit from DME recommendations listed below to increase independence with mobility and to decrease caregiver burden upon returning home.     PT Assessment  Patient needs continued PT services    Follow Up Recommendations  Home health PT;Supervision/Assistance - 24 hour    Does the patient have the potential to tolerate intense rehabilitation      Barriers to Discharge        Equipment Recommendations  3in1 (PT);Hospital bed    Recommendations for Other Services OT consult   Frequency Min 4X/week    Precautions / Restrictions Precautions Precautions: Fall Precaution Comments: wife reports pt  has fallen "couple times" at home  Restrictions Weight Bearing Restrictions: No   Pertinent Vitals/Pain No c/o pain today.       Mobility  Bed Mobility Bed Mobility: Supine to Sit;Sitting - Scoot to Edge of Bed Supine to Sit: 4: Min assist;HOB elevated;With rails Sitting - Scoot to Edge of Bed: 3: Mod assist Details for Bed Mobility Assistance: (A) to elevate shoulders to sitting position on EOB, with use of draw pad; pt c/o pain in Lt UE to light touch; max cues for sequencing; pt rquires max cues and incr time to follow one step commands; pt benefits from having HOB elevated to (A) with bed mobility  Transfers Transfers: Sit to Stand;Stand to Sit Sit to Stand: 4: Min assist;From bed;From chair/3-in-1;With armrests;With upper extremity assist Stand to Sit: 4: Min assist;To chair/3-in-1;With armrests;With upper extremity assist Details for Transfer Assistance: pt requires max multi modal cues for sequencing; pt has difficulty initiating and changing movements, responds well to tactile cues and incr time; pt requires (A) to maintain balance with transfers Ambulation/Gait Ambulation/Gait Assistance: 3: Mod assist Ambulation Distance (Feet): 80 Feet Assistive device: 2 person hand held assist Ambulation/Gait Assistance Details: educated wife on proper guarding technique and use of gt belt; pt requires (A) to maintain balance, especially with directional changes; max multi modal cues for sequencing and safety; pt with festinating parkinsonian gt; has tendency to incr speed and become unsafe with ambulating Gait Pattern: Narrow base of support;Shuffle;Decreased stride length (festinating/parkinsonian ) Gait velocity: pt festinating gt and becomes too fast and unsafe at times  Stairs: No Wheelchair Mobility Wheelchair Mobility: No  PT Diagnosis: Abnormality of gait  PT Problem List: Decreased activity tolerance;Decreased balance;Decreased mobility;Decreased cognition;Decreased  knowledge of use of DME;Decreased safety awareness PT Treatment Interventions: DME instruction;Gait training;Functional mobility training;Therapeutic activities;Therapeutic exercise;Balance training;Neuromuscular re-education;Patient/family education     PT Goals(Current goals can be found in the care plan section) Acute Rehab PT Goals Patient Stated Goal: to walk PT Goal Formulation: With patient/family Time For Goal Achievement: 09/10/13 Potential to Achieve Goals: Fair  Visit Information  Last PT Received On: 08/27/13 Assistance Needed: +1 History of Present Illness: Pt is a 72 y.o. male adm from home secondary to        Prior Functioning  Home Living Family/patient expects to be discharged to:: Private residence Living Arrangements: Spouse/significant other Available Help at Discharge: Family;Available 24 hours/day Type of Home: House Home Access: Level entry Home Layout: One level Home Equipment: Other (comment) (lift chair ) Additional Comments: wife reports pt has lift chair; ambulates without and assistance but has fallen "couple of times"  Prior Function Level of Independence: Needs assistance Gait / Transfers Assistance Needed: per wife; pt was able to ambulate independently; requires (A) transferring in/out of bed secondary to shoulder pain  ADL's / Homemaking Assistance Needed: wife (A) pt as needed; is with pt 24/7 when he is performing ADLs  Communication / Swallowing Assistance Needed: has speech consult  Communication Communication: No difficulties Dominant Hand: Right    Cognition  Cognition Arousal/Alertness: Awake/alert Behavior During Therapy: Anxious;Restless Overall Cognitive Status: History of cognitive impairments - at baseline Memory: Decreased short-term memory    Extremity/Trunk Assessment Upper Extremity Assessment Upper Extremity Assessment: Defer to OT evaluation Lower Extremity Assessment Lower Extremity Assessment: Overall WFL for tasks  assessed Cervical / Trunk Assessment Cervical / Trunk Assessment: Kyphotic   Balance Balance Balance Assessed: Yes Static Sitting Balance Static Sitting - Balance Support: Feet supported;Bilateral upper extremity supported Static Sitting - Level of Assistance: 5: Stand by assistance Static Sitting - Comment/# of Minutes: tolerated ~4 min Static Standing Balance Static Standing - Balance Support: During functional activity;Bilateral upper extremity supported Static Standing - Level of Assistance: 4: Min assist  End of Session PT - End of Session Equipment Utilized During Treatment: Gait belt Activity Tolerance: Patient tolerated treatment well Patient left: in chair;with call bell/phone within reach;with chair alarm set;with family/visitor present Nurse Communication: Mobility status;Precautions  GP     Donell Sievert, [place] [phone] 08/27/2013, 11:28 AM

## 2013-08-27 NOTE — Progress Notes (Signed)
Pt agitated, restless, attempting to pull out lines, and climb out of bed. Unable to be deescalated by wife or nursing staff. Kirtland Bouchard Schorr notified. Per NP, new orders place. Will continue to monitor. Gilman Schmidt

## 2013-08-27 NOTE — Progress Notes (Addendum)
[facility]                                                                                Patient Demographics  Joseph Rice, is a 72 y.o. male, DOB - Jul 07, 1941, RUE:[id number]  Admit date - 08/26/2013   Admitting Physician Shelda Jakes, MD  Outpatient Primary MD for the patient is Rudi Heap, MD  LOS - 1   Chief Complaint  Patient presents with  . Weakness    Poor intake        Assessment & Plan    Principal Problem:   Dehydration Active Problems:   HYPERTENSION   Parkinson's disease   Hypokalemia   Acute encephalopathy  Acute encephalopathy -Likely secondary to sedating medications versus dehydration -May also be secondary to Parkinson's disease -TSH 2.205, vitamin B12 345, folate 446 -Per wife, patient is at his baseline however becomes more confused in the evening time. -Continue to monitor, will consider neurology consult. -CT scan of the head showed no acute intracranial abnormality -UA essentially negative, chest x-ray showed no cardiopulmonary abnormality  Dehydration -Continue IV fluids  Parkinson's disease -Continue Sinemet IR and Exelon, Haldol  Hypokalemia -Will recheck potassium level today. Will replace as needed. -Magnesium level 1.7  Hypertension -Currently stable  Failure to thrive -Pending speech evaluation and possible barium swallow  Left shoulder pain -Will obtain x-ray   Code Status: DO NOT RESUSCITATE  Family Communication: Wife at bedside  Disposition Plan: Admitted.  Will continue to monitor   Procedures None  Consults  None  DVT Prophylaxis  Lovenox   Lab Results  Component Value Date   PLT 182 08/26/2013    Medications  Scheduled Meds: . carbidopa-levodopa  0.5 tablet Oral TID  . enoxaparin (LOVENOX) injection  40 mg Subcutaneous Q24H  . haloperidol lactate  2.5 mg Intravenous Once  . hydrALAZINE  10 mg Intravenous Once  . rivastigmine  6 mg Oral BID  . sodium phosphate  1 enema Rectal  Once   Continuous Infusions: . 0.9 % NaCl with KCl 40 mEq / L 125 mL/hr at 08/26/13 2009   PRN Meds:.  Antibiotics   Anti-infectives   Start     Dose/Rate Route Frequency Ordered Stop   08/26/13 1030  ciprofloxacin (CIPRO) IVPB 400 mg     400 mg 200 mL/hr over 60 Minutes Intravenous  Once 08/26/13 1029 08/26/13 1312       Time Spent in minutes   30 minutes   Loan Oguin D.O. on 08/27/2013 at 2:38 PM  Between 7am to 7pm - Pager - [phone]  After 7pm go to [email or web address] - password [email or web address]  And look for the night coverage person covering for me after hours  [facility] Office  [phone]    Subjective:   Joseph Rice seen and examined today.  Currently resting comfortably with wife at bedside. Patient has no complaints at this time.    Objective:   Filed Vitals:   08/27/13 0017 08/27/13 0607 08/27/13 0900 08/27/13 1300  BP: 117/71 146/61 149/81 122/60  Pulse:  94 110 98  Temp:  97.8 F (36.6 C) 97.2 F (36.2 C) 98.2 F (36.8 C)  TempSrc:  Oral Oral Oral  Resp:  22 24 22   Height:      Weight:      SpO2:  92% 97% 93%    Wt Readings from Last 3 Encounters:  08/26/13 85.458 kg (188 lb 6.4 oz)  08/12/13 88.905 kg (196 lb)  08/01/13 87.544 kg (193 lb)     Intake/Output Summary (Last 24 hours) at 08/27/13 1438 Last data filed at 08/27/13 0700  Gross per 24 hour  Intake 1356.25 ml  Output      0 ml  Net 1356.25 ml    Exam  General: Well developed, well nourished, NAD, appears stated age  HEENT: NCAT, PERRLA, EOMI, Anicteic Sclera, mucous membranes moist. No pharyngeal erythema or exudates  Neck: Supple, no JVD, no masses  Cardiovascular: S1 S2 auscultated, no rubs, murmurs or gallops. Regular rate and rhythm.  Respiratory: Clear to auscultation bilaterally with equal chest rise  Abdomen: Soft, nontender, nondistended, + bowel sounds  Extremities: warm dry without cyanosis clubbing or edema  Neuro: Awake and alert. Has  some cogwheel rigidity. Able to answer some questions.  Skin: Without rashes exudates or nodules  Psych: Normal affect and demeanor with intact judgement and insight  Data Review   Micro Results No results found for this or any previous visit (from the past 240 hour(s)).  Radiology Reports Dg Chest 1 View  08/26/2013   CLINICAL DATA:  73 year old male with shortness of breath. Weakness. Initial encounter.  EXAM: CHEST - 1 VIEW  COMPARISON:  08/05/2013 and earlier.  FINDINGS: Portable AP semi upright view at 1023 hrs. Mildly lower lung volumes. Stable cardiac size and mediastinal contours. Allowing for portable technique, the lungs are clear. Advanced degenerative osseous changes at both shoulders. No pneumothorax.  IMPRESSION: Low lung volumes, otherwise no acute cardiopulmonary abnormality.   Electronically Signed   By: Augusto Gamble M.D.   On: 08/26/2013 10:43   Dg Chest 2 View  08/02/2013   CLINICAL DATA:  Confusion.  EXAM: CHEST  2 VIEW  COMPARISON:  May 09, 2007.  FINDINGS: The heart size and mediastinal contours are within normal limits. No acute pulmonary disease is noted. Small nodular density is seen projected over left lower lobe most likely representing nipple shadow. No pleural effusion or pneumothorax is noted. Mild degenerative disease is seen involving the mid thoracic spine. Degenerative changes are seen involving both glenohumeral joints.  IMPRESSION: Small nodular density seen projected over left lower lobe most likely representing overlying nipple shadow; repeat radiograph with nipple markers is recommended to rule out pulmonary nodule. No other significant abnormality is noted.   Electronically Signed   By: Roque Lias M.D.   On: 08/02/2013 08:55   Ct Head Wo Contrast  08/26/2013   CLINICAL DATA:  Weakness. Poor oral intake. Unable to get out of bed.  EXAM: CT HEAD WITHOUT CONTRAST  TECHNIQUE: Contiguous axial images were obtained from the base of the skull through the  vertex without intravenous contrast.  COMPARISON:  CT head without contrast 08/05/2013.  FINDINGS: No acute cortical infarct, hemorrhage, or mass lesion is present. Moderate generalized atrophy and white matter disease is stable. The ventricles are proportionate to the degree of atrophy. No significant extra-axial fluid collection is present.  The paranasal sinuses branch that and anterior left ethmoid air cell is opacified. Minimal mucosal thickening is noted in the left maxillary sinus. The paranasal sinuses and mastoid air cells are otherwise clear.  IMPRESSION: 1. Stable atrophy and white matter disease. 2. No acute  intracranial abnormality. 3. Minimal sinus disease.   Electronically Signed   By: Gennette Pac M.D.   On: 08/26/2013 10:08   Ct Head Wo Contrast  08/05/2013   CLINICAL DATA:  Weakness, confusion  EXAM: CT HEAD WITHOUT CONTRAST  TECHNIQUE: Contiguous axial images were obtained from the base of the skull through the vertex without contrast.  COMPARISON:  01/14/2011  FINDINGS: Diffuse brain atrophy noted with minor white matter microvascular ischemic changes. Slight ventricular enlargement. No acute intracranial hemorrhage, infarction, mass lesion, midline shift, herniation, hydrocephalus, or extra-axial fluid collection. No definite cerebellar abnormality. Cisterns are patent. Mastoids and sinuses clear. No skull abnormality.  IMPRESSION: Mild brain atrophy.  No acute finding by noncontrast CT.   Electronically Signed   By: Ruel Favors M.D.   On: 08/05/2013 18:33   Ct Abdomen Pelvis W Contrast  08/26/2013   CLINICAL DATA:  Loss of appetite.  Weakness.  EXAM: CT ABDOMEN AND PELVIS WITH CONTRAST  TECHNIQUE: Multidetector CT imaging of the abdomen and pelvis was performed using the standard protocol following bolus administration of intravenous contrast.  CONTRAST:  OMNIPAQUE IOHEXOL 300 MG/ML  SOLN  COMPARISON:  08/05/2011  FINDINGS: There is a small amount of pleural fluid on the right  with minimal dependent atelectasis. No pericardial fluid. There is extensive coronary artery calcification.  There is artifact related to the patient's law arms.  The liver appears normal. No calcified gallstones. The spleen is normal. The pancreas is normal. The adrenal glands are normal. There are a few small renal cysts but no worrisome renal lesion. No stone or obstruction. The aorta shows atherosclerosis but no aneurysm. The IVC is unremarkable. No retroperitoneal mass or adenopathy. No free intraperitoneal fluid or air. The prostate gland is markedly enlarged. The bladder shows wall thickening. There are no enlarged pelvic lymph nodes.  No primary bowel pathology is seen.  There is advanced chronic degenerative disease in the lower lumbar spine.  IMPRESSION: Small amount of pleural fluid on the right with mild dependent pulmonary atelectasis.  Extensive coronary artery calcification.  Markedly enlarged prostate gland.  No abnormal lymph nodes seen.  Degenerative disease of the spine.   Electronically Signed   By: Paulina Fusi M.D.   On: 08/26/2013 14:24   Dg Chest Portable 1 View  08/05/2013   CLINICAL DATA:  Weakness, fall, hypertension  EXAM: PORTABLE CHEST - 1 VIEW  COMPARISON:  08/01/2013  FINDINGS: Low lung volumes. No focal pneumonia, collapse or consolidation. No edema, pleural fluid or pneumothorax. Normal heart size for poor inspiration. Degenerative changes of the spine and shoulders. Trachea is midline.  IMPRESSION: Low volume exam without acute process   Electronically Signed   By: Ruel Favors M.D.   On: 08/05/2013 18:44   Dg Shoulder Left  08/27/2013   CLINICAL DATA:  Shoulder pain  EXAM: LEFT SHOULDER - 2+ VIEW  COMPARISON:  None.  FINDINGS: There is severe narrowing and near complete obliteration of the glenohumeral joint. Deformity of the medial aspect of the humeral head with flattening is appreciated. Marked hypertrophic bone spurring is identified along the humeral head and glenoid.  There also findings concerning for loose bodies within the glenohumeral joint. Areas of calcific tendonitis involving the supraspinatus tendon are also diagnostic consideration. There is no evidence of acute fracture nor dislocation.  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 There also findings concerning for loose bodies within the joint. Further evaluation with MRI, noncontrast in is recommended.   Electronically Signed  By: Salome Holmes M.D.   On: 08/27/2013 11:27    CBC  Recent Labs Lab 08/26/13 1000 08/26/13 1832  WBC 10.1 10.0  HGB 17.9* 16.6  HCT 50.9 47.4  PLT 184 182  MCV 92.2 94.8  MCH 32.4 33.2  MCHC 35.2 35.0  RDW 12.8 13.0  LYMPHSABS 0.9  --   MONOABS 1.0  --   EOSABS 0.0  --   BASOSABS 0.0  --     Chemistries   Recent Labs Lab 08/26/13 1000 08/26/13 1832  NA 141  --   K 3.0*  --   CL 99  --   CO2 29  --   GLUCOSE 113*  --   BUN 15  --   CREATININE 1.03 1.12  CALCIUM 9.3  --   MG  --  1.7  AST 14  --   ALT 9  --   ALKPHOS 84  --   BILITOT 1.1  --    ------------------------------------------------------------------------------------------------------------------ estimated creatinine clearance is 60.5 ml/min (by C-G formula based on Cr of 1.12). ------------------------------------------------------------------------------------------------------------------ No results found for this basename: HGBA1C,  in the last 72 hours ------------------------------------------------------------------------------------------------------------------ No results found for this basename: CHOL, HDL, LDLCALC, TRIG, CHOLHDL, LDLDIRECT,  in the last 72 hours ------------------------------------------------------------------------------------------------------------------  Recent Labs  08/26/13 1832  TSH 2.205   ------------------------------------------------------------------------------------------------------------------  Recent  Labs  08/26/13 1832  VITAMINB12 345    Coagulation profile No results found for this basename: INR, PROTIME,  in the last 168 hours  No results found for this basename: DDIMER,  in the last 72 hours  Cardiac Enzymes  Recent Labs Lab 08/26/13 1000  TROPONINI <0.30   ------------------------------------------------------------------------------------------------------------------ No components found with this basename: POCBNP,

## 2013-08-28 ENCOUNTER — Inpatient Hospital Stay (HOSPITAL_COMMUNITY): Payer: Medicare HMO

## 2013-08-28 DIAGNOSIS — R413 Other amnesia: Secondary | ICD-10-CM

## 2013-08-28 DIAGNOSIS — E785 Hyperlipidemia, unspecified: Secondary | ICD-10-CM

## 2013-08-28 DIAGNOSIS — M25519 Pain in unspecified shoulder: Secondary | ICD-10-CM

## 2013-08-28 LAB — BASIC METABOLIC PANEL
CO2: 22 mEq/L (ref 19–32)
Creatinine, Ser: 0.85 mg/dL (ref 0.50–1.35)
GFR calc Af Amer: >90 mL/min (ref >90)
GFR calc non Af Amer: 84 mL/min — ABNORMAL LOW (ref >90)
Glucose, Bld: 106 mg/dL — ABNORMAL HIGH (ref 70–99)
Potassium: 4.4 mEq/L (ref 3.7–5.3)
Sodium: 145 mEq/L (ref 137–147)

## 2013-08-28 LAB — CBC
HCT: 41.1 % (ref 39.0–52.0)
Hemoglobin: 14 g/dL (ref 13.0–17.0)
MCH: 32.9 pg (ref 26.0–34.0)
RBC: 4.26 MIL/uL (ref 4.22–5.81)
RDW: 13.1 % (ref 11.5–15.5)

## 2013-08-28 MED ORDER — MIRTAZAPINE 15 MG PO TABS
15.0000 mg | ORAL_TABLET | Freq: Every day | ORAL | Status: DC
  Administered 2013-08-28 – 2013-08-31 (×4): 15 mg via ORAL
  Filled 2013-08-28 (×6): qty 1

## 2013-08-28 MED ORDER — RIVASTIGMINE TARTRATE 3 MG PO CAPS: 6.0000 mg | ORAL_CAPSULE | Freq: Two times a day (BID) | ORAL | Status: DC

## 2013-08-28 MED ORDER — ALPRAZOLAM 0.25 MG PO TABS
0.2500 mg | ORAL_TABLET | Freq: Four times a day (QID) | ORAL | Status: DC | PRN
  Administered 2013-08-28 (×2): 0.25 mg via ORAL
  Filled 2013-08-28 (×2): qty 1

## 2013-08-28 MED ORDER — ALPRAZOLAM 0.5 MG PO TABS: 1.0000 mg | ORAL_TABLET | Freq: Every evening | ORAL | Status: DC | PRN

## 2013-08-28 MED ORDER — CYANOCOBALAMIN 1000 MCG/ML IJ SOLN
1000.0000 ug | Freq: Once | INTRAMUSCULAR | Status: AC
  Administered 2013-08-28: 1000 ug via INTRAMUSCULAR
  Filled 2013-08-28: qty 1

## 2013-08-28 MED ORDER — ASPIRIN EC 81 MG PO TBEC
81.0000 mg | DELAYED_RELEASE_TABLET | Freq: Every day | ORAL | Status: DC
  Administered 2013-08-28 – 2013-09-01 (×5): 81 mg via ORAL
  Filled 2013-08-28 (×6): qty 1

## 2013-08-28 MED ORDER — AMLODIPINE BESYLATE 5 MG PO TABS
5.0000 mg | ORAL_TABLET | Freq: Every day | ORAL | Status: DC
  Administered 2013-08-28 – 2013-09-01 (×5): 5 mg via ORAL
  Filled 2013-08-28 (×5): qty 1

## 2013-08-28 MED ORDER — QUETIAPINE FUMARATE 100 MG PO TABS
100.0000 mg | ORAL_TABLET | Freq: Every day | ORAL | Status: DC
  Administered 2013-08-28 – 2013-08-31 (×4): 100 mg via ORAL
  Filled 2013-08-28 (×5): qty 1

## 2013-08-28 NOTE — Progress Notes (Signed)
PT Cancellation Note  Patient Details Name: Joseph Rice MRN: [id number] DOB: 22-Apr-1941   Cancelled Treatment:    Reason Eval/Treat Not Completed: Pt's wife requesting to hold.  She states pt has not had his parkinson's medication since Saturday & he is experiencing increased trembling & she does not feel he is appropriate to participate at this time.      Lara Mulch 08/28/2013, 1:57 PM  Verdell Face, PTA [phone] 08/28/2013

## 2013-08-28 NOTE — Progress Notes (Signed)
[facility]                                                                                Patient Demographics  Joseph Rice, is a 72 y.o. male, DOB - 1941-05-22, ZOX:[id number]  Admit date - 08/26/2013   Admitting Physician Shelda Jakes, MD  Outpatient Primary MD for the patient is Rudi Heap, MD  LOS - 2   Chief Complaint  Patient presents with  . Weakness    Poor intake        Assessment & Plan    Principal Problem:   Dehydration Active Problems:   HYPERTENSION   Parkinson's disease   Hypokalemia   Acute encephalopathy  Acute encephalopathy -Improving, Likely secondary to sedating medications versus dehydration -May also be secondary to Parkinson's disease -TSH 2.205, vitamin B12 345, folate 446 -Per wife, patient is at his baseline however becomes more confused in the evening time. -Continue to monitor -CT scan of the head showed no acute intracranial abnormality -UA essentially negative, chest x-ray showed no cardiopulmonary abnormality  Dehydration -Continue IV fluids  Parkinson's disease -Continue Sinemet IR and Exelon, Haldol  Hypokalemia -k 4.4 today, will continue to monitor. -Magnesium level 1.7  Hypertension -Currently stable  Failure to thrive -MBS conducted, dysphagia 3 diet recommended -will restart diet  Left shoulder pain -Xray showed loose bodies within the joint -MRI recommended  Code Status: DO NOT RESUSCITATE  Family Communication: Wife at bedside  Disposition Plan: Admitted.  Will continue to monitor   Procedures None  Consults  None  DVT Prophylaxis  Lovenox   Lab Results  Component Value Date   PLT 165 08/28/2013    Medications  Scheduled Meds: . amLODipine  5 mg Oral Daily  . aspirin EC  81 mg Oral Daily  . carbidopa-levodopa  0.5 tablet Oral TID  . enoxaparin (LOVENOX) injection  40 mg Subcutaneous Q24H  . haloperidol lactate  2.5 mg Intravenous Once  . hydrALAZINE  10 mg Intravenous  Once  . mirtazapine  15 mg Oral QHS  . QUEtiapine  100 mg Oral QHS  . rivastigmine  6 mg Oral BID  . rivastigmine  6 mg Oral BID   Continuous Infusions: . 0.9 % NaCl with KCl 40 mEq / L 125 mL/hr (08/28/13 0441)   PRN Meds:.ALPRAZolam  Antibiotics   Anti-infectives   Start     Dose/Rate Route Frequency Ordered Stop   08/26/13 1030  ciprofloxacin (CIPRO) IVPB 400 mg     400 mg 200 mL/hr over 60 Minutes Intravenous  Once 08/26/13 1029 08/26/13 1312       Time Spent in minutes   30 minutes   Shawnee Gambone D.O. on 08/28/2013 at 2:31 PM  Between 7am to 7pm - Pager - [phone]  After 7pm go to [email or web address] - password [email or web address]  And look for the night coverage person covering for me after hours  [facility] Office  [phone]    Subjective:   Joseph Rice seen and examined today.  Currently resting comfortably with wife at bedside. Patient has no complaints at this time.    Objective:   Filed Vitals:   08/27/13 2200 08/28/13 0546 08/28/13  1007 08/28/13 1340  BP: 140/70 115/60 132/85 155/63  Pulse: 100 95 95 120  Temp: 99 F (37.2 C) 98.2 F (36.8 C) 97.9 F (36.6 C) 97.5 F (36.4 C)  TempSrc: Axillary Oral Oral Oral  Resp:  22 20 20   Height:      Weight:      SpO2: 96% 94% 95% 93%    Wt Readings from Last 3 Encounters:  08/26/13 85.458 kg (188 lb 6.4 oz)  08/12/13 88.905 kg (196 lb)  08/01/13 87.544 kg (193 lb)     Intake/Output Summary (Last 24 hours) at 08/28/13 1431 Last data filed at 08/28/13 1300  Gross per 24 hour  Intake 2889.58 ml  Output      7 ml  Net 2882.58 ml    Exam  General: Well developed, well nourished, NAD, appears stated age  HEENT: NCAT, PERRLA, EOMI, Anicteic Sclera, mucous membranes moist. No pharyngeal erythema or exudates  Neck: Supple, no JVD, no masses  Cardiovascular: S1 S2 auscultated, no rubs, murmurs or gallops. Regular rate and rhythm.  Respiratory: Clear to auscultation bilaterally with  equal chest rise  Abdomen: Soft, nontender, nondistended, + bowel sounds  Extremities: warm dry without cyanosis clubbing or edema  Neuro: Awake and alert. Has some cogwheel rigidity. Able to answer some questions.  Skin: Without rashes exudates or nodules  Psych: Normal affect and demeanor with intact judgement and insight  Data Review   Micro Results No results found for this or any previous visit (from the past 240 hour(s)).  Radiology Reports Dg Chest 1 View  08/26/2013   CLINICAL DATA:  72 year old male with shortness of breath. Weakness. Initial encounter.  EXAM: CHEST - 1 VIEW  COMPARISON:  08/05/2013 and earlier.  FINDINGS: Portable AP semi upright view at 1023 hrs. Mildly lower lung volumes. Stable cardiac size and mediastinal contours. Allowing for portable technique, the lungs are clear. Advanced degenerative osseous changes at both shoulders. No pneumothorax.  IMPRESSION: Low lung volumes, otherwise no acute cardiopulmonary abnormality.   Electronically Signed   By: Augusto Gamble M.D.   On: 08/26/2013 10:43   Dg Chest 2 View  08/02/2013   CLINICAL DATA:  Confusion.  EXAM: CHEST  2 VIEW  COMPARISON:  May 09, 2007.  FINDINGS: The heart size and mediastinal contours are within normal limits. No acute pulmonary disease is noted. Small nodular density is seen projected over left lower lobe most likely representing nipple shadow. No pleural effusion or pneumothorax is noted. Mild degenerative disease is seen involving the mid thoracic spine. Degenerative changes are seen involving both glenohumeral joints.  IMPRESSION: Small nodular density seen projected over left lower lobe most likely representing overlying nipple shadow; repeat radiograph with nipple markers is recommended to rule out pulmonary nodule. No other significant abnormality is noted.   Electronically Signed   By: Roque Lias M.D.   On: 08/02/2013 08:55   Ct Head Wo Contrast  08/26/2013   CLINICAL DATA:  Weakness.  Poor oral intake. Unable to get out of bed.  EXAM: CT HEAD WITHOUT CONTRAST  TECHNIQUE: Contiguous axial images were obtained from the base of the skull through the vertex without intravenous contrast.  COMPARISON:  CT head without contrast 08/05/2013.  FINDINGS: No acute cortical infarct, hemorrhage, or mass lesion is present. Moderate generalized atrophy and white matter disease is stable. The ventricles are proportionate to the degree of atrophy. No significant extra-axial fluid collection is present.  The paranasal sinuses branch that and anterior left ethmoid  air cell is opacified. Minimal mucosal thickening is noted in the left maxillary sinus. The paranasal sinuses and mastoid air cells are otherwise clear.  IMPRESSION: 1. Stable atrophy and white matter disease. 2. No acute intracranial abnormality. 3. Minimal sinus disease.   Electronically Signed   By: Gennette Pac M.D.   On: 08/26/2013 10:08   Ct Head Wo Contrast  08/05/2013   CLINICAL DATA:  Weakness, confusion  EXAM: CT HEAD WITHOUT CONTRAST  TECHNIQUE: Contiguous axial images were obtained from the base of the skull through the vertex without contrast.  COMPARISON:  01/14/2011  FINDINGS: Diffuse brain atrophy noted with minor white matter microvascular ischemic changes. Slight ventricular enlargement. No acute intracranial hemorrhage, infarction, mass lesion, midline shift, herniation, hydrocephalus, or extra-axial fluid collection. No definite cerebellar abnormality. Cisterns are patent. Mastoids and sinuses clear. No skull abnormality.  IMPRESSION: Mild brain atrophy.  No acute finding by noncontrast CT.   Electronically Signed   By: Ruel Favors M.D.   On: 08/05/2013 18:33   Ct Abdomen Pelvis W Contrast  08/26/2013   CLINICAL DATA:  Loss of appetite.  Weakness.  EXAM: CT ABDOMEN AND PELVIS WITH CONTRAST  TECHNIQUE: Multidetector CT imaging of the abdomen and pelvis was performed using the standard protocol following bolus administration of  intravenous contrast.  CONTRAST:  OMNIPAQUE IOHEXOL 300 MG/ML  SOLN  COMPARISON:  08/05/2011  FINDINGS: There is a small amount of pleural fluid on the right with minimal dependent atelectasis. No pericardial fluid. There is extensive coronary artery calcification.  There is artifact related to the patient's law arms.  The liver appears normal. No calcified gallstones. The spleen is normal. The pancreas is normal. The adrenal glands are normal. There are a few small renal cysts but no worrisome renal lesion. No stone or obstruction. The aorta shows atherosclerosis but no aneurysm. The IVC is unremarkable. No retroperitoneal mass or adenopathy. No free intraperitoneal fluid or air. The prostate gland is markedly enlarged. The bladder shows wall thickening. There are no enlarged pelvic lymph nodes.  No primary bowel pathology is seen.  There is advanced chronic degenerative disease in the lower lumbar spine.  IMPRESSION: Small amount of pleural fluid on the right with mild dependent pulmonary atelectasis.  Extensive coronary artery calcification.  Markedly enlarged prostate gland.  No abnormal lymph nodes seen.  Degenerative disease of the spine.   Electronically Signed   By: Paulina Fusi M.D.   On: 08/26/2013 14:24   Dg Chest Portable 1 View  08/05/2013   CLINICAL DATA:  Weakness, fall, hypertension  EXAM: PORTABLE CHEST - 1 VIEW  COMPARISON:  08/01/2013  FINDINGS: Low lung volumes. No focal pneumonia, collapse or consolidation. No edema, pleural fluid or pneumothorax. Normal heart size for poor inspiration. Degenerative changes of the spine and shoulders. Trachea is midline.  IMPRESSION: Low volume exam without acute process   Electronically Signed   By: Ruel Favors M.D.   On: 08/05/2013 18:44   Dg Shoulder Left  08/27/2013   CLINICAL DATA:  Shoulder pain  EXAM: LEFT SHOULDER - 2+ VIEW  COMPARISON:  None.  FINDINGS: There is severe narrowing and near complete obliteration of the glenohumeral joint.  Deformity of the medial aspect of the humeral head with flattening is appreciated. Marked hypertrophic bone spurring is identified along the humeral head and glenoid. There also findings concerning for loose bodies within the glenohumeral joint. Areas of calcific tendonitis involving the supraspinatus tendon are also diagnostic consideration. There is no evidence  of acute fracture nor dislocation.  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 There also findings concerning for loose bodies within the joint. Further evaluation with MRI, noncontrast in is recommended.   Electronically Signed   By: Salome Holmes M.D.   On: 08/27/2013 11:27    CBC  Recent Labs Lab 08/26/13 1000 08/26/13 1832 08/28/13 0551  WBC 10.1 10.0 9.0  HGB 17.9* 16.6 14.0  HCT 50.9 47.4 41.1  PLT 184 182 165  MCV 92.2 94.8 96.5  MCH 32.4 33.2 32.9  MCHC 35.2 35.0 34.1  RDW 12.8 13.0 13.1  LYMPHSABS 0.9  --   --   MONOABS 1.0  --   --   EOSABS 0.0  --   --   BASOSABS 0.0  --   --     Chemistries   Recent Labs Lab 08/26/13 1000 08/26/13 1832 08/28/13 0551  NA 141  --  145  K 3.0*  --  4.4  CL 99  --  111  CO2 29  --  22  GLUCOSE 113*  --  106*  BUN 15  --  24*  CREATININE 1.03 1.12 0.85  CALCIUM 9.3  --  8.6  MG  --  1.7  --   AST 14  --   --   ALT 9  --   --   ALKPHOS 84  --   --   BILITOT 1.1  --   --    ------------------------------------------------------------------------------------------------------------------ estimated creatinine clearance is 79.7 ml/min (by C-G formula based on Cr of 0.85). ------------------------------------------------------------------------------------------------------------------ No results found for this basename: HGBA1C,  in the last 72 hours ------------------------------------------------------------------------------------------------------------------ No results found for this basename: CHOL, HDL, LDLCALC, TRIG, CHOLHDL, LDLDIRECT,   in the last 72 hours ------------------------------------------------------------------------------------------------------------------  Recent Labs  08/26/13 1832  TSH 2.205   ------------------------------------------------------------------------------------------------------------------  Recent Labs  08/26/13 1832  VITAMINB12 345    Coagulation profile No results found for this basename: INR, PROTIME,  in the last 168 hours  No results found for this basename: DDIMER,  in the last 72 hours  Cardiac Enzymes  Recent Labs Lab 08/26/13 1000  TROPONINI <0.30   ------------------------------------------------------------------------------------------------------------------ No components found with this basename: POCBNP,

## 2013-08-28 NOTE — Care Management Note (Signed)
   CARE MANAGEMENT NOTE 08/28/2013  Patient:  Joseph Rice, Joseph Rice   Account Number:  [id number]  Date Initiated:  08/28/2013  Documentation initiated by:  Philomene Haff  Subjective/Objective Assessment:   MD recommending HHRN, HHPT and DME. Pt will need hospital bed, wheelchair and 3:1.     Action/Plan:   Met with pt and wife, and discussed HH needs and DME. Will ask for orders for hospital bed, wheelchair and walker.   Anticipated DC Date:  08/30/2013   Anticipated DC Plan:  HOME W HOME HEALTH SERVICES         [facility] Choice  DURABLE MEDICAL EQUIPMENT  HOME HEALTH   Choice offered to / List presented to:  C-3 Spouse   DME arranged  HOSPITAL BED  WHEELCHAIR - MANUAL  3-N-1      DME agency  [facility]     HH arranged  HH-1 RN  HH-2 PT  HH-4 NURSE'S AIDE      HH agency  [facility]   Status of service:  In process, will continue to follow Medicare Important Message given?   (If response is "NO", the following Medicare IM given date fields will be blank) Date Medicare IM given:   Date Additional Medicare IM given:    Discharge Disposition:  HOME W HOME HEALTH SERVICES  Per UR Regulation:    If discussed at Long Length of Stay Meetings, dates discussed:    Comments:  08/28/2013 Met with pt and wife. Wife plans to continue care at home, however pt is unable to assist with lifting and sitting up at times, now with inablility to swallow. Pt will require hospital bed, needs wheelchair for transportation and 3:1 commode. CM will notify MD and place orders as needed. Johny Shock RN MPH, case manager, [phone]

## 2013-08-28 NOTE — Procedures (Signed)
Objective Swallowing Evaluation: Modified Barium Swallowing Study  Patient Details  Name: Joseph Rice MRN: [id number] Date of Birth: [birth year]  Today's Date: 08/28/2013 Time: 1353-1410 SLP Time Calculation (min): 17 min  Past Medical History:  Past Medical History  Diagnosis Date  . Other specified disorder of penis   . Other and unspecified hyperlipidemia   . Pain in joint, shoulder region   . Other chest pain   . Dry eyes   . Inguinal hernia   . Cataract   . Hypertension   . Parkinson's disease   . Cancer   . Memory disturbance 01/07/2013  . Dementia    Past Surgical History:  Past Surgical History  Procedure Laterality Date  . Back surgery    . Cataract extraction, bilateral    . Shoulder surgery Left   . Lumbar spine surgery     HPI:  Joseph Rice is a 72 y.o. 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 All hx per wife and chart review. On multiple sedating meds and recently started remeron for wandering/not sleeping at night. Also, wife was giving patient benadryl at night to keep him sedated until 3 nights ago. Usually, able to walk on his own, goes out to lunch and shopping with wife. Extensive Workup in ED only significant for potassium of 3.0 and microscopic hematuria. Wife provides 24 hour care and is not interested in placement. Wife denies any h/o dysphagia and PNA.     Assessment / Plan / Recommendation Clinical Impression  Dysphagia Diagnosis: Mild oral phase dysphagia;Mild pharyngeal phase dysphagia Clinical impression: Pt presents with a mild sensory-motor based dysphagia characterized by decreased lingual manipulation and awareness resulting in decreased bolus cohesion/containment. Premature spillage occurs to the level of the vallecular with small sips of thin liquid as well as solid POs, however with large sips premature spillage occurs to the pyriform sinuses and  results in frank penetration above the vocal cords. Pt was able to clear penetrates with cued cough, however pt with decreased ability to follow commands consistently. Recommend that pt initiate Dys 3 textures and thin liquids with full supervision to ensure small bites/sips.     Treatment Recommendation  Therapy as outlined in treatment plan below    Diet Recommendation Dysphagia 3 (Mechanical Soft);Thin liquid   Liquid Administration via: Cup;Straw Medication Administration: Whole meds with puree Supervision: Staff to assist with self feeding;Full supervision/cueing for compensatory strategies Compensations: Slow rate;Small sips/bites Postural Changes and/or Swallow Maneuvers: Seated upright 90 degrees    Other  Recommendations Oral Care Recommendations: Oral care BID   Follow Up Recommendations  None    Frequency and Duration min 2x/week  1 week   Pertinent Vitals/Pain N/A    SLP Swallow Goals     General Date of Onset: 08/26/13 HPI: Joseph Rice is a 72 y.o. 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 All hx per wife and chart review. On multiple sedating meds and recently started remeron for wandering/not sleeping at night. Also, wife was giving patient benadryl at night to keep him sedated until 3 nights ago. Usually, able to walk on his own, goes out to lunch and shopping with wife. Extensive Workup in ED only significant for potassium of 3.0 and microscopic hematuria. Wife provides 24 hour care and is not interested in placement. Wife denies any h/o dysphagia and PNA. Type  of Study: Modified Barium Swallowing Study Reason for Referral: Objectively evaluate swallowing function Previous Swallow Assessment: clinical swallow eval completed 12/30 with recommendations for NPO pending objective testing Diet Prior to this Study: NPO Temperature Spikes Noted: Yes (low grade) Respiratory Status:  Room air History of Recent Intubation: No Behavior/Cognition: Alert;Confused;Requires cueing Oral Cavity - Dentition: Adequate natural dentition Self-Feeding Abilities: Needs assist Patient Positioning: Upright in chair Baseline Vocal Quality: Low vocal intensity Volitional Cough: Cognitively unable to elicit Volitional Swallow: Unable to elicit Anatomy: Within functional limits Pharyngeal Secretions: Not observed secondary MBS    Reason for Referral Objectively evaluate swallowing function   Oral Phase Oral Preparation/Oral Phase Oral Phase: Impaired Oral - Thin Oral - Thin Straw: Weak lingual manipulation;Lingual pumping;Other (Comment) (decreased bolus cohesion/containment) Oral - Solids Oral - Puree: Weak lingual manipulation;Lingual pumping;Reduced posterior propulsion;Delayed oral transit;Other (Comment);Lingual/palatal residue (decreased bolus containment/cohesion) Oral - Mechanical Soft: Weak lingual manipulation;Lingual pumping;Delayed oral transit;Other (Comment);Lingual/palatal residue (decreased bolus cohesion/containment)   Pharyngeal Phase Pharyngeal Phase Pharyngeal Phase: Impaired Pharyngeal - Thin Pharyngeal - Thin Straw: Premature spillage to pyriform sinuses;Penetration/Aspiration before swallow;Reduced tongue base retraction Penetration/Aspiration details (thin straw): Material enters airway, remains ABOVE vocal cords and not ejected out Pharyngeal - Solids Pharyngeal - Puree: Premature spillage to valleculae;Reduced tongue base retraction;Pharyngeal residue - valleculae Pharyngeal - Mechanical Soft: Premature spillage to valleculae;Reduced tongue base retraction;Pharyngeal residue - valleculae  Cervical Esophageal Phase    GO    Cervical Esophageal Phase Cervical Esophageal Phase: [facility]         Joseph Rice, M.A. CCC-SLP [phone]  Joseph Rice 08/28/2013, 2:44 PM

## 2013-08-29 ENCOUNTER — Inpatient Hospital Stay (HOSPITAL_COMMUNITY): Payer: Medicare HMO

## 2013-08-29 LAB — URINALYSIS, ROUTINE W REFLEX MICROSCOPIC
BILIRUBIN URINE: NEGATIVE
Glucose, UA: NEGATIVE mg/dL
KETONES UR: 15 mg/dL — AB
Leukocytes, UA: NEGATIVE
NITRITE: NEGATIVE
PH: 5.5 (ref 5.0–8.0)
PROTEIN: NEGATIVE mg/dL
Specific Gravity, Urine: 1.021 (ref 1.005–1.030)
Urobilinogen, UA: 1 mg/dL (ref 0.0–1.0)

## 2013-08-29 LAB — URINE MICROSCOPIC-ADD ON

## 2013-08-29 LAB — CBC
HEMATOCRIT: 43.3 % (ref 39.0–52.0)
Hemoglobin: 14.6 g/dL (ref 13.0–17.0)
MCH: 32.8 pg (ref 26.0–34.0)
MCHC: 33.7 g/dL (ref 30.0–36.0)
MCV: 97.3 fL (ref 78.0–100.0)
Platelets: 211 10*3/uL (ref 150–400)
RBC: 4.45 MIL/uL (ref 4.22–5.81)
RDW: 13.4 % (ref 11.5–15.5)
WBC: 12.3 10*3/uL — AB (ref 4.0–10.5)

## 2013-08-29 MED ORDER — ACETAMINOPHEN 325 MG PO TABS
650.0000 mg | ORAL_TABLET | ORAL | Status: DC | PRN
  Administered 2013-08-29 – 2013-08-30 (×4): 650 mg via ORAL
  Filled 2013-08-29 (×4): qty 2

## 2013-08-29 NOTE — Progress Notes (Signed)
Patient's temperature during routine vital signs check by nurse tech was 100.2, MD notified, 650 mg of tylenol ordered. Temperature equals 99.1 an hour after tylenol was given. Will continue to monitor.

## 2013-08-29 NOTE — Plan of Care (Signed)
Problem: Phase I Progression Outcomes Goal: Initial discharge plan identified Outcome: Completed/Met Date Met:  08/29/13 Home with wife, DME, and HH.

## 2013-08-29 NOTE — Plan of Care (Signed)
Problem: Consults Goal: Nutrition Consult-if indicated Outcome: Completed/Met Date Met:  08/29/13 Requested dietitian consult for ensure pudding, chocolate. Pt tends toward favorite foods like cheeseburgers and tater tots. Manya Silvas, RN

## 2013-08-29 NOTE — Progress Notes (Signed)
[facility]                                                                                Patient Demographics  Joseph Rice, is a 73 y.o. male, DOB - [birth year], HC:[id number]  Admit date - 08/26/2013   Admitting Physician Mervin Kung, MD  Outpatient Primary MD for the patient is Redge Gainer, MD  LOS - 3   Chief Complaint  Patient presents with  . Weakness    Poor intake        Assessment & Plan    Principal Problem:   Dehydration Active Problems:   HYPERTENSION   Parkinson's disease   Hypokalemia   Acute encephalopathy  Acute encephalopathy -Improving, Likely secondary to sedating medications versus dehydration -May also be secondary to Parkinson's disease -TSH 2.205, vitamin B12 345, folate 446 -Per wife, patient is at his baseline however becomes more confused in the evening time. -Continue to monitor -CT scan of the head showed no acute intracranial abnormality -UA essentially negative, chest x-ray showed no cardiopulmonary abnormality  Fever -Will continue to monitor CBC -Previous UA, CXR were negative for infection -Will repeat and order blood cultures as well  Dehydration -Continue IV fluids  Parkinson's disease -Continue Sinemet IR and Exelon, Haldol  Hypokalemia -Resolved, will continue to monitor. -Magnesium level 1.7  Hypertension -Currently stable  Failure to thrive -MBS conducted, dysphagia 3 diet recommended -will restart diet  Left shoulder pain -Xray showed loose bodies within the joint -MRI recommended, however, wife did not wish to proceed  Vit B12 Deficiency -Given IV B12, will need to continue PO form  Code Status: DO NOT RESUSCITATE  Family Communication: Wife at bedside  Disposition Plan: Admitted.  Will continue to monitor   Procedures None  Consults  None  DVT Prophylaxis  Lovenox   Lab Results  Component Value Date   PLT 165 08/28/2013    Medications  Scheduled Meds: . amLODipine  5  mg Oral Daily  . aspirin EC  81 mg Oral Daily  . carbidopa-levodopa  0.5 tablet Oral TID  . enoxaparin (LOVENOX) injection  40 mg Subcutaneous Q24H  . haloperidol lactate  2.5 mg Intravenous Once  . hydrALAZINE  10 mg Intravenous Once  . mirtazapine  15 mg Oral QHS  . QUEtiapine  100 mg Oral QHS   Continuous Infusions:   PRN Meds:.acetaminophen, ALPRAZolam, ALPRAZolam  Antibiotics   Anti-infectives   Start     Dose/Rate Route Frequency Ordered Stop   08/26/13 1030  ciprofloxacin (CIPRO) IVPB 400 mg     400 mg 200 mL/hr over 60 Minutes Intravenous  Once 08/26/13 1029 08/26/13 1312       Time Spent in minutes   30 minutes   Veleka Djordjevic D.O. on 08/29/2013 at 12:42 PM  Between 7am to 7pm - Pager - [phone]  After 7pm go to [email or web address] - password [email or web address]  And look for the night coverage person covering for me after hours  [facility] Office  [phone]    Subjective:   [name] seen and examined today.  Currently resting comfortably with family. Patient has no complaints at this time.    Objective:  Filed Vitals:   08/29/13 0456 08/29/13 0633 08/29/13 0834 08/29/13 1156  BP: 169/82  143/85 145/87  Pulse: 107  103 105  Temp: 100.2 F (37.9 C) 99.1 F (37.3 C) 99.3 F (37.4 C) 100.2 F (37.9 C)  TempSrc: Oral Axillary Axillary Axillary  Resp: 18  20 18   Height:      Weight:      SpO2: 94%  91% 92%    Wt Readings from Last 3 Encounters:  08/28/13 86.909 kg (191 lb 9.6 oz)  08/12/13 88.905 kg (196 lb)  08/01/13 87.544 kg (193 lb)     Intake/Output Summary (Last 24 hours) at 08/29/13 1242 Last data filed at 08/29/13 0046  Gross per 24 hour  Intake    240 ml  Output      5 ml  Net    235 ml    Exam  General: Well developed, well nourished, NAD, appears stated age  HEENT: NCAT, PERRLA, EOMI, Anicteic Sclera, mucous membranes moist.   Neck: Supple, no JVD, no masses  Cardiovascular: S1 S2 auscultated, no rubs, murmurs  or gallops. Regular rate and rhythm.  Respiratory: Clear to auscultation bilaterally with equal chest rise  Abdomen: Soft, tender to palpation, nondistended, + bowel sounds  Extremities: warm dry without cyanosis clubbing or edema  Neuro: Awake and alert. Has some cogwheel rigidity. Able to answer some questions.  Skin: Without rashes exudates or nodules  Data Review   Micro Results No results found for this or any previous visit (from the past 240 hour(s)).  Radiology Reports Dg Chest 1 View  08/26/2013   CLINICAL DATA:  73 year old male with shortness of breath. Weakness. Initial encounter.  EXAM: CHEST - 1 VIEW  COMPARISON:  08/05/2013 and earlier.  FINDINGS: Portable AP semi upright view at 1023 hrs. Mildly lower lung volumes. Stable cardiac size and mediastinal contours. Allowing for portable technique, the lungs are clear. Advanced degenerative osseous changes at both shoulders. No pneumothorax.  IMPRESSION: Low lung volumes, otherwise no acute cardiopulmonary abnormality.   Electronically Signed   By: Lars Pinks M.D.   On: 08/26/2013 10:43   Dg Chest 2 View  08/02/2013   CLINICAL DATA:  Confusion.  EXAM: CHEST  2 VIEW  COMPARISON:  May 09, 2007.  FINDINGS: The heart size and mediastinal contours are within normal limits. No acute pulmonary disease is noted. Small nodular density is seen projected over left lower lobe most likely representing nipple shadow. No pleural effusion or pneumothorax is noted. Mild degenerative disease is seen involving the mid thoracic spine. Degenerative changes are seen involving both glenohumeral joints.  IMPRESSION: Small nodular density seen projected over left lower lobe most likely representing overlying nipple shadow; repeat radiograph with nipple markers is recommended to rule out pulmonary nodule. No other significant abnormality is noted.   Electronically Signed   By: Sabino Dick M.D.   On: 08/02/2013 08:55   Ct Head Wo Contrast  08/26/2013    CLINICAL DATA:  Weakness. Poor oral intake. Unable to get out of bed.  EXAM: CT HEAD WITHOUT CONTRAST  TECHNIQUE: Contiguous axial images were obtained from the base of the skull through the vertex without intravenous contrast.  COMPARISON:  CT head without contrast 08/05/2013.  FINDINGS: No acute cortical infarct, hemorrhage, or mass lesion is present. Moderate generalized atrophy and white matter disease is stable. The ventricles are proportionate to the degree of atrophy. No significant extra-axial fluid collection is present.  The paranasal sinuses branch that and anterior  left ethmoid air cell is opacified. Minimal mucosal thickening is noted in the left maxillary sinus. The paranasal sinuses and mastoid air cells are otherwise clear.  IMPRESSION: 1. Stable atrophy and white matter disease. 2. No acute intracranial abnormality. 3. Minimal sinus disease.   Electronically Signed   By: Lawrence Santiago M.D.   On: 08/26/2013 10:08   Ct Head Wo Contrast  08/05/2013   CLINICAL DATA:  Weakness, confusion  EXAM: CT HEAD WITHOUT CONTRAST  TECHNIQUE: Contiguous axial images were obtained from the base of the skull through the vertex without contrast.  COMPARISON:  01/14/2011  FINDINGS: Diffuse brain atrophy noted with minor white matter microvascular ischemic changes. Slight ventricular enlargement. No acute intracranial hemorrhage, infarction, mass lesion, midline shift, herniation, hydrocephalus, or extra-axial fluid collection. No definite cerebellar abnormality. Cisterns are patent. Mastoids and sinuses clear. No skull abnormality.  IMPRESSION: Mild brain atrophy.  No acute finding by noncontrast CT.   Electronically Signed   By: Daryll Brod M.D.   On: 08/05/2013 18:33   Ct Abdomen Pelvis W Contrast  08/26/2013   CLINICAL DATA:  Loss of appetite.  Weakness.  EXAM: CT ABDOMEN AND PELVIS WITH CONTRAST  TECHNIQUE: Multidetector CT imaging of the abdomen and pelvis was performed using the standard protocol  following bolus administration of intravenous contrast.  CONTRAST:  172mL OMNIPAQUE IOHEXOL 300 MG/ML  SOLN  COMPARISON:  08/05/2011  FINDINGS: There is a small amount of pleural fluid on the right with minimal dependent atelectasis. No pericardial fluid. There is extensive coronary artery calcification.  There is artifact related to the patient's law arms.  The liver appears normal. No calcified gallstones. The spleen is normal. The pancreas is normal. The adrenal glands are normal. There are a few small renal cysts but no worrisome renal lesion. No stone or obstruction. The aorta shows atherosclerosis but no aneurysm. The IVC is unremarkable. No retroperitoneal mass or adenopathy. No free intraperitoneal fluid or air. The prostate gland is markedly enlarged. The bladder shows wall thickening. There are no enlarged pelvic lymph nodes.  No primary bowel pathology is seen.  There is advanced chronic degenerative disease in the lower lumbar spine.  IMPRESSION: Small amount of pleural fluid on the right with mild dependent pulmonary atelectasis.  Extensive coronary artery calcification.  Markedly enlarged prostate gland.  No abnormal lymph nodes seen.  Degenerative disease of the spine.   Electronically Signed   By: Nelson Chimes M.D.   On: 08/26/2013 14:24   Dg Chest Portable 1 View  08/05/2013   CLINICAL DATA:  Weakness, fall, hypertension  EXAM: PORTABLE CHEST - 1 VIEW  COMPARISON:  08/01/2013  FINDINGS: Low lung volumes. No focal pneumonia, collapse or consolidation. No edema, pleural fluid or pneumothorax. Normal heart size for poor inspiration. Degenerative changes of the spine and shoulders. Trachea is midline.  IMPRESSION: Low volume exam without acute process   Electronically Signed   By: Daryll Brod M.D.   On: 08/05/2013 18:44   Dg Shoulder Left  08/27/2013   CLINICAL DATA:  Shoulder pain  EXAM: LEFT SHOULDER - 2+ VIEW  COMPARISON:  None.  FINDINGS: There is severe narrowing and near complete  obliteration of the glenohumeral joint. Deformity of the medial aspect of the humeral head with flattening is appreciated. Marked hypertrophic bone spurring is identified along the humeral head and glenoid. There also findings concerning for loose bodies within the glenohumeral joint. Areas of calcific tendonitis involving the supraspinatus tendon are also diagnostic consideration. There is  no evidence of acute fracture nor dislocation.  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 There also findings concerning for loose bodies within the joint. Further evaluation with MRI, noncontrast in is recommended.   Electronically Signed   By: Margaree Mackintosh M.D.   On: 08/27/2013 11:27    CBC  Recent Labs Lab 08/26/13 1000 08/26/13 1832 08/28/13 0551  WBC 10.1 10.0 9.0  HGB 17.9* 16.6 14.0  HCT 50.9 47.4 41.1  PLT 184 182 165  MCV 92.2 94.8 96.5  MCH 32.4 33.2 32.9  MCHC 35.2 35.0 34.1  RDW 12.8 13.0 13.1  LYMPHSABS 0.9  --   --   MONOABS 1.0  --   --   EOSABS 0.0  --   --   BASOSABS 0.0  --   --     Chemistries   Recent Labs Lab 08/26/13 1000 08/26/13 1832 08/28/13 0551  NA 141  --  145  K 3.0*  --  4.4  CL 99  --  111  CO2 29  --  22  GLUCOSE 113*  --  106*  BUN 15  --  24*  CREATININE 1.03 1.12 0.85  CALCIUM 9.3  --  8.6  MG  --  1.7  --   AST 14  --   --   ALT 9  --   --   ALKPHOS 84  --   --   BILITOT 1.1  --   --    ------------------------------------------------------------------------------------------------------------------ estimated creatinine clearance is 80.2 ml/min (by C-G formula based on Cr of 0.85). ------------------------------------------------------------------------------------------------------------------ No results found for this basename: HGBA1C,  in the last 72 hours ------------------------------------------------------------------------------------------------------------------ No results found for this basename: CHOL,  HDL, LDLCALC, TRIG, CHOLHDL, LDLDIRECT,  in the last 72 hours ------------------------------------------------------------------------------------------------------------------  Recent Labs  08/26/13 1832  TSH 2.205   ------------------------------------------------------------------------------------------------------------------  Recent Labs  08/26/13 1832  VITAMINB12 345    Coagulation profile No results found for this basename: INR, PROTIME,  in the last 168 hours  No results found for this basename: DDIMER,  in the last 72 hours  Cardiac Enzymes  Recent Labs Lab 08/26/13 1000  TROPONINI <0.30   ------------------------------------------------------------------------------------------------------------------ No components found with this basename: POCBNP,

## 2013-08-30 ENCOUNTER — Inpatient Hospital Stay (HOSPITAL_COMMUNITY): Payer: Medicare HMO

## 2013-08-30 DIAGNOSIS — N179 Acute kidney failure, unspecified: Secondary | ICD-10-CM

## 2013-08-30 DIAGNOSIS — R509 Fever, unspecified: Secondary | ICD-10-CM

## 2013-08-30 LAB — NA AND K (SODIUM & POTASSIUM), RAND UR
POTASSIUM UR: 88 meq/L
SODIUM UR: 60 meq/L

## 2013-08-30 LAB — CBC
HCT: 43.6 % (ref 39.0–52.0)
Hemoglobin: 14.7 g/dL (ref 13.0–17.0)
MCH: 32.3 pg (ref 26.0–34.0)
MCHC: 33.7 g/dL (ref 30.0–36.0)
MCV: 95.8 fL (ref 78.0–100.0)
Platelets: 207 10*3/uL (ref 150–400)
RBC: 4.55 MIL/uL (ref 4.22–5.81)
RDW: 13.5 % (ref 11.5–15.5)
WBC: 11 10*3/uL — ABNORMAL HIGH (ref 4.0–10.5)

## 2013-08-30 LAB — BASIC METABOLIC PANEL
BUN: 43 mg/dL — ABNORMAL HIGH (ref 6–23)
CHLORIDE: 116 meq/L — AB (ref 96–112)
CO2: 20 mEq/L (ref 19–32)
Calcium: 9.3 mg/dL (ref 8.4–10.5)
Creatinine, Ser: 1.51 mg/dL — ABNORMAL HIGH (ref 0.50–1.35)
GFR calc Af Amer: 51 mL/min — ABNORMAL LOW (ref >90)
GFR calc non Af Amer: 44 mL/min — ABNORMAL LOW (ref >90)
GLUCOSE: 132 mg/dL — AB (ref 70–99)
POTASSIUM: 3.8 meq/L (ref 3.7–5.3)
SODIUM: 154 meq/L — AB (ref 137–147)

## 2013-08-30 LAB — INFLUENZA PANEL BY PCR (TYPE A & B)
H1N1FLUPCR: NOT DETECTED
INFLBPCR: NEGATIVE
Influenza A By PCR: NEGATIVE

## 2013-08-30 LAB — CK: Total CK: 92 U/L (ref 7–232)

## 2013-08-30 LAB — CREATININE, URINE, RANDOM: CREATININE, URINE: 166.92 mg/dL

## 2013-08-30 MED ORDER — TAMSULOSIN HCL 0.4 MG PO CAPS
0.4000 mg | ORAL_CAPSULE | Freq: Every day | ORAL | Status: DC
  Administered 2013-08-30 – 2013-09-01 (×3): 0.4 mg via ORAL
  Filled 2013-08-30 (×3): qty 1

## 2013-08-30 MED ORDER — ENSURE PUDDING PO PUDG
1.0000 | Freq: Two times a day (BID) | ORAL | Status: DC
  Administered 2013-08-30 – 2013-09-01 (×4): 1 via ORAL

## 2013-08-30 MED ORDER — SODIUM CHLORIDE 0.45 % IV SOLN
INTRAVENOUS | Status: AC
  Administered 2013-08-30: 13:00:00 via INTRAVENOUS

## 2013-08-30 NOTE — Progress Notes (Signed)
PT Cancellation Note  Patient Details Name: Joseph Rice MRN: [id number] DOB: 06-Dec-1940   Cancelled Treatment:    Reason Eval/Treat Not Completed: Other (comment) discussed with wife D/C plan and patient's current mobility. Wife reports she has not seen pt get up since Tuesday. Is concerned about how she is going to transport him into her house. Pt was sleeping and wife did not want to wake him at this time. Answered questions regarding proper guarding technique and equipment to be delivered.  Will re-attempt to see pt at next available time. Pt wife left with advanced home care representative.    Elie Confer N,PT [phone] 08/30/2013, 11:10 AM

## 2013-08-30 NOTE — Progress Notes (Signed)
INITIAL NUTRITION ASSESSMENT  DOCUMENTATION CODES Per approved criteria  -Obesity Unspecified   INTERVENTION: Add Ensure Pudding po BID, each supplement provides 170 kcal and 4 grams of protein. RD to continue to follow nutrition care plan.  NUTRITION DIAGNOSIS: Predicted sub-optimal energy intake related to recent poor appetite as evidenced by wife's report.   Goal: Intake to meet >90% of estimated nutrition needs.  Monitor:  weight trends, lab trends, I/O's, PO intake, supplement tolerance  Reason for Assessment: MD Consult for Assessment  73 y.o. male  Admitting Dx: Dehydration  ASSESSMENT: PMHx significant for Parkinson's disease, dementia. Admitted with poor appetite and lethargy x 2 days. Work-up reveals dehydration vs sedating medications.  Per wife, pt recently started on remeron. BSE completed by SLP on 12/30, recommending MBSS. MBSS completed 12/31 with recommendations for Dysphagia 3 diet with thin liquids. Per wife, pt is eating a little bit better with each meal. Enjoys easy-to-eat foods. Also likes Ensure Pudding.  Pt with stable weight PTA. Per wife, pt was eating well prior to symptoms.   Pt is at nutrition risk 2/2 acute and chronic medical issues. Wife denies any questions/concerns at this time. States that pt is going to leave today.  Sodium elevated at 154.  Height: Ht Readings from Last 1 Encounters:  08/26/13 5\' 7"  (1.702 m)    Weight: Wt Readings from Last 1 Encounters:  08/28/13 191 lb 9.6 oz (86.909 kg)    Ideal Body Weight: 148 lb  % Ideal Body Weight: 129%  Wt Readings from Last 10 Encounters:  08/28/13 191 lb 9.6 oz (86.909 kg)  08/12/13 196 lb (88.905 kg)  08/01/13 193 lb (87.544 kg)  03/13/13 193 lb (87.544 kg)  01/07/13 196 lb (88.905 kg)  08/31/12 198 lb (89.812 kg)  12/17/12 199 lb 3.2 oz (90.357 kg)  09/13/12 191 lb (86.637 kg)  08/05/11 190 lb (86.183 kg)    Usual Body Weight: 190 - 195 lb  % Usual Body Weight:  100%  BMI:  Body mass index is 30 kg/(m^2). Obese Class I  Estimated Nutritional Needs: Kcal: 1500 - 5427 Protein: 80 - 90 Fluid: 1.8 - 2 liters  Skin: intact  Diet Order: Dysphagia 3; thin  EDUCATION NEEDS: -No education needs identified at this time   Intake/Output Summary (Last 24 hours) at 08/30/13 1151 Last data filed at 08/30/13 1000  Gross per 24 hour  Intake    580 ml  Output      0 ml  Net    580 ml    Last BM: 1/1  Labs:   Recent Labs Lab 08/26/13 1000 08/26/13 1832 08/28/13 0551 08/30/13 0813  NA 141  --  145 154*  K 3.0*  --  4.4 3.8  CL 99  --  111 116*  CO2 29  --  22 20  BUN 15  --  24* 43*  CREATININE 1.03 1.12 0.85 1.51*  CALCIUM 9.3  --  8.6 9.3  MG  --  1.7  --   --   GLUCOSE 113*  --  106* 132*    CBG (last 3)  No results found for this basename: GLUCAP,  in the last 72 hours  Scheduled Meds: . amLODipine  5 mg Oral Daily  . aspirin EC  81 mg Oral Daily  . carbidopa-levodopa  0.5 tablet Oral TID  . enoxaparin (LOVENOX) injection  40 mg Subcutaneous Q24H  . haloperidol lactate  2.5 mg Intravenous Once  . hydrALAZINE  10 mg Intravenous  Once  . mirtazapine  15 mg Oral QHS  . QUEtiapine  100 mg Oral QHS    Continuous Infusions:   Past Medical History  Diagnosis Date  . Other specified disorder of penis   . Other and unspecified hyperlipidemia   . Pain in joint, shoulder region   . Other chest pain   . Dry eyes   . Inguinal hernia   . Cataract   . Hypertension   . Parkinson's disease   . Cancer   . Memory disturbance 01/07/2013  . Dementia     Past Surgical History  Procedure Laterality Date  . Back surgery    . Cataract extraction, bilateral    . Shoulder surgery Left   . Lumbar spine surgery      Inda Coke MS, RD, LDN Pager: [phone] After-hours pager: [phone]

## 2013-08-30 NOTE — Progress Notes (Signed)
Speech Language Pathology Treatment: Dysphagia  Patient Details Name: Joseph Rice MRN: [id number] DOB: 15-Nov-1940 Today's Date: 08/30/2013 Time: 4854-6270 SLP Time Calculation (min): 11 min  Assessment / Plan / Recommendation Clinical Impression  Pt seen for f/u swallowing treatment. Upon chart review, note fevers of unclear origin. CXR without clear signs of PNA and breath sounds clear bilaterally per most recent MD note. Pt sleeping upon SLP arrival, and wife politely requested that PO trials be held at this time, stating that he has had minimal PO intake and that he likely would not consume any if awakened. Session focused on family education with wife, with review of MBS results and aspiration risk with Parkinsons. Wife verbalized her understanding, and tearfully demonstrated her awareness of risks. SLP will continue to follow for diet tolerance.   HPI HPI: Joseph Rice is a 73 y.o. 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 All hx per wife and chart review. On multiple sedating meds and recently started remeron for wandering/not sleeping at night. Also, wife was giving patient benadryl at night to keep him sedated until 3 nights ago. Usually, able to walk on his own, goes out to lunch and shopping with wife. Extensive Workup in ED only significant for potassium of 3.0 and microscopic hematuria. Wife provides 24 hour care and is not interested in placement. Wife denies any h/o dysphagia and PNA.   Pertinent Vitals N/A  SLP Plan  Continue with current plan of care    Recommendations Diet recommendations: Dysphagia 3 (mechanical soft);Thin liquid Liquids provided via: Cup;Straw Medication Administration: Whole meds with puree Supervision: Staff to assist with self feeding;Full supervision/cueing for compensatory strategies Compensations: Slow rate;Small sips/bites Postural Changes and/or Swallow  Maneuvers: Seated upright 90 degrees              Oral Care Recommendations: Oral care BID Follow up Recommendations: None Plan: Continue with current plan of care    GO       Germain Osgood, M.A. CCC-SLP [phone]  Germain Osgood 08/30/2013, 4:30 PM

## 2013-08-30 NOTE — Care Management Note (Signed)
   CARE MANAGEMENT NOTE 08/30/2013  Patient:  Joseph Rice, Joseph Rice   Account Number:  [id number]  Date Initiated:  08/28/2013  Documentation initiated by:  Tavien Chestnut  Subjective/Objective Assessment:   MD recommending HHRN, HHPT and DME. Pt will need hospital bed, wheelchair and 3:1.     Action/Plan:   Met with pt and wife, and discussed HH needs and DME. Will ask for orders for hospital bed, wheelchair and walker.  08/30/13 Met with wife DME to be delivered to room and home.   Anticipated DC Date:  08/30/2013   Anticipated DC Plan:  [place]         [facility] Choice  [place]   Choice offered to / List presented to:  C-3 Spouse   DME arranged  [place]  3-N-1      DME agency  [place] arranged  HH-1 RN  [place].   Status of service:  Completed, signed off Medicare Important Message given?   (If response is "NO", the following Medicare IM given date fields will be blank) Date Medicare IM given:   Date Additional Medicare IM given:    Discharge Disposition:  [place]  Per UR Regulation:    If discussed at Long Length of Stay Meetings, dates discussed:    Comments:   08/30/12 Met with pt and wife, spoke with [facility] and she will talk with wife to arrange home deliverys. Wheelchair and 3:1 will be delivered to room. Wife informed of this. Jasmine Pang RN MPH   08/28/2013 Met with pt and wife. Wife plans to continue care at home, however pt is unable to assist with lifting and sitting up at times, now with inablility to swallow. Pt will require hospital bed, needs wheelchair for transportation and 3:1 commode. CM will notify MD and place orders as needed. Jasmine Pang RN MPH, case manager, [phone]

## 2013-08-30 NOTE — Progress Notes (Addendum)
[facility]                                                                                Patient Demographics  Joseph Rice, is a 73 y.o. male, DOB - March 19, 1941, QAS:[id number]  Admit date - 08/26/2013   Admitting Physician Mervin Kung, MD  Outpatient Primary MD for the patient is Redge Gainer, MD  LOS - 4   Chief Complaint  Patient presents with  . Weakness    Poor intake        Assessment & Plan    Principal Problem:   Dehydration Active Problems:   HYPERTENSION   Parkinson's disease   Hypokalemia   Acute encephalopathy  Acute encephalopathy -Improving, Likely secondary to sedating medications versus dehydration -May also be secondary to Parkinson's disease -TSH 2.205, vitamin B12 345, folate 446 -Per wife, patient is at his baseline however becomes more confused in the evening time. -Continue to monitor -CT scan of the head showed no acute intracranial abnormality -UA essentially negative, chest x-ray showed no cardiopulmonary abnormality -blood cultures pending  Fever -Will continue to monitor CBC -Previous UA, CXR were negative for infection -blood cultures pendnig -influenza PCR ordered  Acute kidney Injury with hypernatremia/dehydration -Cr 1.51, will restart IVF and continue to monitor -Will obtain urine electrolytes and renal ultrasound -if does not improve, will consult nephrology -will hold nephrotoxic agents  Parkinson's disease -Continue Sinemet IR and Exelon, Haldol  Hypokalemia -Resolved, will continue to monitor. -Magnesium level 1.7  Hypertension -Currently stable  Failure to thrive -MBS conducted, dysphagia 3 diet recommended -will restart diet  Left shoulder pain -Xray showed loose bodies within the joint -MRI recommended, however, wife did not wish to proceed  Vit B12 Deficiency -Given IV B12, will need to continue PO form  Hematuria -Will continue to monitor -Will d/c lovenox   Code Status: DO NOT  RESUSCITATE  Family Communication: Wife at bedside  Disposition Plan: Admitted.  Will continue to monitor.     Procedures None  Consults  None  DVT Prophylaxis  SCDs   Lab Results  Component Value Date   PLT 207 08/30/2013    Medications  Scheduled Meds: . amLODipine  5 mg Oral Daily  . aspirin EC  81 mg Oral Daily  . carbidopa-levodopa  0.5 tablet Oral TID  . haloperidol lactate  2.5 mg Intravenous Once  . hydrALAZINE  10 mg Intravenous Once  . mirtazapine  15 mg Oral QHS  . QUEtiapine  100 mg Oral QHS   Continuous Infusions: . sodium chloride     PRN Meds:.acetaminophen, ALPRAZolam, ALPRAZolam  Antibiotics   Anti-infectives   Start     Dose/Rate Route Frequency Ordered Stop   08/26/13 1030  ciprofloxacin (CIPRO) IVPB 400 mg     400 mg 200 mL/hr over 60 Minutes Intravenous  Once 08/26/13 1029 08/26/13 1312       Time Spent in minutes   30 minutes   Javarri Segal D.O. on 08/30/2013 at 12:27 PM  Between 7am to 7pm - Pager - [phone]  After 7pm go to [email or web address] - password [email or web address]  And look for the night coverage person covering for me after hours  [facility] Office  [phone]    Subjective:   Joseph Rice seen and examined today.  Currently resting comfortably with wife at bedside.  No complaints at this time.     Objective:   Filed Vitals:   08/29/13 1636 08/29/13 2034 08/30/13 0500 08/30/13 0900  BP: 144/84 154/104 131/75 152/90  Pulse: 101 103 107 76  Temp: 100 F (37.8 C) 100.4 F (38 C) 98.8 F (37.1 C) 101.1 F (38.4 C)  TempSrc: Oral Oral Oral Oral  Resp: 18 18 18 22   Height:      Weight:      SpO2: 91% 94% 93% 99%    Wt Readings from Last 3 Encounters:  08/28/13 86.909 kg (191 lb 9.6 oz)  08/12/13 88.905 kg (196 lb)  08/01/13 87.544 kg (193 lb)     Intake/Output Summary (Last 24 hours) at 08/30/13 1227 Last data filed at 08/30/13 1000  Gross per 24 hour  Intake    580 ml  Output      0 ml  Net     580 ml    Exam  General: Well developed, well nourished, NAD, appears stated age  HEENT: NCAT, PERRLA, EOMI, Anicteic Sclera, mucous membranes moist.   Neck: Supple, no JVD, no masses  Cardiovascular: S1 S2 auscultated, no rubs, murmurs or gallops. Regular rate and rhythm.  Respiratory: Clear to auscultation bilaterally with equal chest rise  Abdomen: Soft, tender to palpation, nondistended, + bowel sounds  Extremities: warm without cyanosis clubbing or edema  Neuro: Awake and alert. Has some cogwheel rigidity. Able to answer some questions.  Skin: Without rashes exudates or nodules  Data Review   Micro Results No results found for this or any previous visit (from the past 240 hour(s)).  Radiology Reports Dg Chest 1 View  08/26/2013   CLINICAL DATA:  73 year old male with shortness of breath. Weakness. Initial encounter.  EXAM: CHEST - 1 VIEW  COMPARISON:  08/05/2013 and earlier.  FINDINGS: Portable AP semi upright view at 1023 hrs. Mildly lower lung volumes. Stable cardiac size and mediastinal contours. Allowing for portable technique, the lungs are clear. Advanced degenerative osseous changes at both shoulders. No pneumothorax.  IMPRESSION: Low lung volumes, otherwise no acute cardiopulmonary abnormality.   Electronically Signed   By: Lars Pinks M.D.   On: 08/26/2013 10:43   Dg Chest 2 View  08/02/2013   CLINICAL DATA:  Confusion.  EXAM: CHEST  2 VIEW  COMPARISON:  May 09, 2007.  FINDINGS: The heart size and mediastinal contours are within normal limits. No acute pulmonary disease is noted. Small nodular density is seen projected over left lower lobe most likely representing nipple shadow. No pleural effusion or pneumothorax is noted. Mild degenerative disease is seen involving the mid thoracic spine. Degenerative changes are seen involving both glenohumeral joints.  IMPRESSION: Small nodular density seen projected over left lower lobe most likely representing overlying  nipple shadow; repeat radiograph with nipple markers is recommended to rule out pulmonary nodule. No other significant abnormality is noted.   Electronically Signed   By: Sabino Dick M.D.   On: 08/02/2013 08:55   Ct Head Wo Contrast  08/26/2013   CLINICAL DATA:  Weakness. Poor oral intake. Unable to get out of bed.  EXAM: CT HEAD WITHOUT CONTRAST  TECHNIQUE: Contiguous axial images were obtained from the base of the skull through the vertex without intravenous contrast.  COMPARISON:  CT head without contrast 08/05/2013.  FINDINGS: No acute cortical infarct, hemorrhage,  or mass lesion is present. Moderate generalized atrophy and white matter disease is stable. The ventricles are proportionate to the degree of atrophy. No significant extra-axial fluid collection is present.  The paranasal sinuses branch that and anterior left ethmoid air cell is opacified. Minimal mucosal thickening is noted in the left maxillary sinus. The paranasal sinuses and mastoid air cells are otherwise clear.  IMPRESSION: 1. Stable atrophy and white matter disease. 2. No acute intracranial abnormality. 3. Minimal sinus disease.   Electronically Signed   By: Lawrence Santiago M.D.   On: 08/26/2013 10:08   Ct Head Wo Contrast  08/05/2013   CLINICAL DATA:  Weakness, confusion  EXAM: CT HEAD WITHOUT CONTRAST  TECHNIQUE: Contiguous axial images were obtained from the base of the skull through the vertex without contrast.  COMPARISON:  01/14/2011  FINDINGS: Diffuse brain atrophy noted with minor white matter microvascular ischemic changes. Slight ventricular enlargement. No acute intracranial hemorrhage, infarction, mass lesion, midline shift, herniation, hydrocephalus, or extra-axial fluid collection. No definite cerebellar abnormality. Cisterns are patent. Mastoids and sinuses clear. No skull abnormality.  IMPRESSION: Mild brain atrophy.  No acute finding by noncontrast CT.   Electronically Signed   By: Daryll Brod M.D.   On: 08/05/2013  18:33   Ct Abdomen Pelvis W Contrast  08/26/2013   CLINICAL DATA:  Loss of appetite.  Weakness.  EXAM: CT ABDOMEN AND PELVIS WITH CONTRAST  TECHNIQUE: Multidetector CT imaging of the abdomen and pelvis was performed using the standard protocol following bolus administration of intravenous contrast.  CONTRAST:  172mL OMNIPAQUE IOHEXOL 300 MG/ML  SOLN  COMPARISON:  08/05/2011  FINDINGS: There is a small amount of pleural fluid on the right with minimal dependent atelectasis. No pericardial fluid. There is extensive coronary artery calcification.  There is artifact related to the patient's law arms.  The liver appears normal. No calcified gallstones. The spleen is normal. The pancreas is normal. The adrenal glands are normal. There are a few small renal cysts but no worrisome renal lesion. No stone or obstruction. The aorta shows atherosclerosis but no aneurysm. The IVC is unremarkable. No retroperitoneal mass or adenopathy. No free intraperitoneal fluid or air. The prostate gland is markedly enlarged. The bladder shows wall thickening. There are no enlarged pelvic lymph nodes.  No primary bowel pathology is seen.  There is advanced chronic degenerative disease in the lower lumbar spine.  IMPRESSION: Small amount of pleural fluid on the right with mild dependent pulmonary atelectasis.  Extensive coronary artery calcification.  Markedly enlarged prostate gland.  No abnormal lymph nodes seen.  Degenerative disease of the spine.   Electronically Signed   By: Nelson Chimes M.D.   On: 08/26/2013 14:24   Dg Chest Portable 1 View  08/05/2013   CLINICAL DATA:  Weakness, fall, hypertension  EXAM: PORTABLE CHEST - 1 VIEW  COMPARISON:  08/01/2013  FINDINGS: Low lung volumes. No focal pneumonia, collapse or consolidation. No edema, pleural fluid or pneumothorax. Normal heart size for poor inspiration. Degenerative changes of the spine and shoulders. Trachea is midline.  IMPRESSION: Low volume exam without acute process    Electronically Signed   By: Daryll Brod M.D.   On: 08/05/2013 18:44   Dg Shoulder Left  08/27/2013   CLINICAL DATA:  Shoulder pain  EXAM: LEFT SHOULDER - 2+ VIEW  COMPARISON:  None.  FINDINGS: There is severe narrowing and near complete obliteration of the glenohumeral joint. Deformity of the medial aspect of the humeral head with flattening is  appreciated. Marked hypertrophic bone spurring is identified along the humeral head and glenoid. There also findings concerning for loose bodies within the glenohumeral joint. Areas of calcific tendonitis involving the supraspinatus tendon are also diagnostic consideration. There is no evidence of acute fracture nor dislocation.  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 There also findings concerning for loose bodies within the joint. Further evaluation with MRI, noncontrast in is recommended.   Electronically Signed   By: Margaree Mackintosh M.D.   On: 08/27/2013 11:27    CBC  Recent Labs Lab 08/26/13 1000 08/26/13 1832 08/28/13 0551 08/29/13 1344 08/30/13 0813  WBC 10.1 10.0 9.0 12.3* 11.0*  HGB 17.9* 16.6 14.0 14.6 14.7  HCT 50.9 47.4 41.1 43.3 43.6  PLT 184 182 165 211 207  MCV 92.2 94.8 96.5 97.3 95.8  MCH 32.4 33.2 32.9 32.8 32.3  MCHC 35.2 35.0 34.1 33.7 33.7  RDW 12.8 13.0 13.1 13.4 13.5  LYMPHSABS 0.9  --   --   --   --   MONOABS 1.0  --   --   --   --   EOSABS 0.0  --   --   --   --   BASOSABS 0.0  --   --   --   --     Chemistries   Recent Labs Lab 08/26/13 1000 08/26/13 1832 08/28/13 0551 08/30/13 0813  NA 141  --  145 154*  K 3.0*  --  4.4 3.8  CL 99  --  111 116*  CO2 29  --  22 20  GLUCOSE 113*  --  106* 132*  BUN 15  --  24* 43*  CREATININE 1.03 1.12 0.85 1.51*  CALCIUM 9.3  --  8.6 9.3  MG  --  1.7  --   --   AST 14  --   --   --   ALT 9  --   --   --   ALKPHOS 84  --   --   --   BILITOT 1.1  --   --   --     ------------------------------------------------------------------------------------------------------------------ estimated creatinine clearance is 45.2 ml/min (by C-G formula based on Cr of 1.51). ------------------------------------------------------------------------------------------------------------------ No results found for this basename: HGBA1C,  in the last 72 hours ------------------------------------------------------------------------------------------------------------------ No results found for this basename: CHOL, HDL, LDLCALC, TRIG, CHOLHDL, LDLDIRECT,  in the last 72 hours ------------------------------------------------------------------------------------------------------------------ No results found for this basename: TSH, T4TOTAL, FREET3, T3FREE, THYROIDAB,  in the last 72 hours ------------------------------------------------------------------------------------------------------------------ No results found for this basename: VITAMINB12, FOLATE, FERRITIN, TIBC, IRON, RETICCTPCT,  in the last 72 hours  Coagulation profile No results found for this basename: INR, PROTIME,  in the last 168 hours  No results found for this basename: DDIMER,  in the last 72 hours  Cardiac Enzymes  Recent Labs Lab 08/26/13 1000  TROPONINI <0.30   ------------------------------------------------------------------------------------------------------------------ No components found with this basename: POCBNP,

## 2013-08-30 NOTE — Consult Note (Addendum)
Consult: urinary retention; hydronephrosis, ARF Requested by: Dr. Ree Kida   History of Present Illness:   73 yo admitted for FTT with h/o advanced dementia and Parkinson's disease. His Cr increased from 0.85 two days ago to 1.5 today. He also had some fever. UA showed 3-6 rbc and rare bacteria. A renal bladder U/s was obtained which showed right hydro mild, poor visual of left kidney poss mild hydro and distended bladder (~760 ml my calculation). This compared to CT A/P on admission four days ago which showed no hydro and a non-distended bladder, but he does have a large prostate and median lobe. A foley was placed and drained 1068ml per wife. I reviewed CT and U/S images.  He is well known to our practice and saw Dr. Diona Fanti Oct 2013 for BPH and elevated PSA. He did not return Oct 2014 as planned. I reviewed [employer] chart notes.   Per his wife patient tends to void with an intermittent stream and has nocturnal enuresis.   Past Medical History  Diagnosis Date  . Other specified disorder of penis   . Other and unspecified hyperlipidemia   . Pain in joint, shoulder region   . Other chest pain   . Dry eyes   . Inguinal hernia   . Cataract   . Hypertension   . Parkinson's disease   . Cancer   . Memory disturbance 01/07/2013  . Dementia    Past Surgical History  Procedure Laterality Date  . Back surgery    . Cataract extraction, bilateral    . Shoulder surgery Left   . Lumbar spine surgery      Home Medications:  Prescriptions prior to admission  Medication Sig Dispense Refill  . ALPRAZolam (XANAX) 1 MG tablet Take 1 tablet (1 mg total) by mouth at bedtime as needed for anxiety.  30 tablet  2  . amLODipine (NORVASC) 5 MG tablet Take 5 mg by mouth daily.      Marland Kitchen aspirin 81 MG tablet Take 81 mg by mouth at bedtime.       . carbidopa-levodopa (SINEMET IR) 25-100 MG per tablet Take 0.5 tablets by mouth 3 (three) times daily.      . Cholecalciferol 2000 UNITS CAPS Take 2,000 Units by  mouth daily.      . enalapril-hydrochlorothiazide (VASERETIC) 10-25 MG per tablet Take 1 tablet by mouth daily.      . meloxicam (MOBIC) 15 MG tablet Take 7.5 mg by mouth daily.      . mirtazapine (REMERON) 15 MG tablet Take 15-30 mg by mouth at bedtime.      . Pitavastatin Calcium (LIVALO) 4 MG TABS Take 1 tablet (4 mg total) by mouth at bedtime.  30 tablet  2  . QUEtiapine (SEROQUEL) 50 MG tablet Take 100 mg by mouth at bedtime.      . rivastigmine (EXELON) 6 MG capsule Take 6 mg by mouth 2 (two) times daily.      . traMADol (ULTRAM) 50 MG tablet Take 1 tablet (50 mg total) by mouth every 6 (six) hours as needed. For pain  30 tablet  0   Allergies:  Allergies  Allergen Reactions  . Aricept [Donepezil Hydrochloride] Other (See Comments)    unknown  . Lipitor [Atorvastatin Calcium]     abd pain  . Lovaza [Omega-3-Acid Ethyl Esters] Other (See Comments)    Could not swallow  . Morphine And Related     confusion  . Niacin-Simvastatin Er     aching  .  Penicillins Rash    Family History  Problem Relation Age of Onset  . Cancer Mother   . Heart disease Father   . Heart attack Father    Social History:  reports that he has never smoked. He has never used smokeless tobacco. He reports that he does not drink alcohol or use illicit drugs.  ROS: A complete review of systems was performed.  All systems are negative except for pertinent findings as noted. Review of Systems  Unable to perform ROS    Physical Exam:  Vital signs in last 24 hours: Temp:  [98.8 F (37.1 C)-101.1 F (38.4 C)] 100.8 F (38.2 C) (01/02 2007) Pulse Rate:  [76-107] 99 (01/02 2007) Resp:  [16-22] 16 (01/02 2007) BP: (131-152)/(69-90) 131/69 mmHg (01/02 2007) SpO2:  [93 %-99 %] 94 % (01/02 2007) General:  Sleeping comfortably, No acute distress HEENT: Normocephalic, atraumatic Neck: No JVD or lymphadenopathy Cardiovascular: Regular rate and rhythm Lungs: Regular rate and effort Abdomen: Soft,  nontender, nondistended, no abdominal masses Back: No CVA tenderness Extremities: No edema Neurologic: Grossly intact GU: foley in place, urine clear  Laboratory Data:  Results for orders placed during the hospital encounter of 08/26/13 (from the past 24 hour(s))  CBC     Status: Abnormal   Collection Time    08/30/13  8:13 AM      Result Value Range   WBC 11.0 (*) 4.0 - 10.5 K/uL   RBC 4.55  4.22 - 5.81 MIL/uL   Hemoglobin 14.7  13.0 - 17.0 g/dL   HCT 43.6  39.0 - 52.0 %   MCV 95.8  78.0 - 100.0 fL   MCH 32.3  26.0 - 34.0 pg   MCHC 33.7  30.0 - 36.0 g/dL   RDW 13.5  11.5 - 15.5 %   Platelets 207  150 - 400 K/uL  BASIC METABOLIC PANEL     Status: Abnormal   Collection Time    08/30/13  8:13 AM      Result Value Range   Sodium 154 (*) 137 - 147 mEq/L   Potassium 3.8  3.7 - 5.3 mEq/L   Chloride 116 (*) 96 - 112 mEq/L   CO2 20  19 - 32 mEq/L   Glucose, Bld 132 (*) 70 - 99 mg/dL   BUN 43 (*) 6 - 23 mg/dL   Creatinine, Ser 1.51 (*) 0.50 - 1.35 mg/dL   Calcium 9.3  8.4 - 10.5 mg/dL   GFR calc non Af Amer 44 (*) >90 mL/min   GFR calc Af Amer 51 (*) >90 mL/min  INFLUENZA PANEL BY PCR     Status: None   Collection Time    08/30/13 12:00 PM      Result Value Range   Influenza A By PCR NEGATIVE  NEGATIVE   Influenza B By PCR NEGATIVE  NEGATIVE   H1N1 flu by pcr NOT DETECTED  NOT DETECTED  CREATININE, URINE, RANDOM     Status: None   Collection Time    08/30/13 12:48 PM      Result Value Range   Creatinine, Urine 166.92    NA AND K (SODIUM & POTASSIUM), RAND UR     Status: None   Collection Time    08/30/13 12:48 PM      Result Value Range   Sodium, Ur 60     Potassium Urine Timed 88    CK     Status: None   Collection Time    08/30/13  1:00 PM      Result Value Range   Total CK 92  7 - 232 U/L   No results found for this or any previous visit (from the past 240 hour(s)). Creatinine:  Recent Labs  08/26/13 1000 08/26/13 1832 08/28/13 0551 08/30/13 0813   CREATININE 1.03 1.12 0.85 1.51*    Impression/Assessment/Plan: 1)urinary retention - multifactorial - parkinson's, BPH, FTT - discussed with wife nature, r/b/a to foley catheter. Discussed he may need it for the long run. However, we can start tamsulosin and he can see Dr. Diona Fanti or one of our NP's in the office over the coming 2-3 weeks for a void trial.  2) ARF - likely related to above 3)hydronephrosis - likely related to above - CT showed no hydro, stone, etc.   I will notify Dr. Diona Fanti of patient's admission.   Fredricka Bonine 08/30/2013, 8:38 PM

## 2013-08-30 NOTE — Progress Notes (Signed)
Utilization review completed.  

## 2013-08-31 DIAGNOSIS — R339 Retention of urine, unspecified: Secondary | ICD-10-CM

## 2013-08-31 LAB — BASIC METABOLIC PANEL
BUN: 39 mg/dL — ABNORMAL HIGH (ref 6–23)
CHLORIDE: 112 meq/L (ref 96–112)
CO2: 26 meq/L (ref 19–32)
Calcium: 8.5 mg/dL (ref 8.4–10.5)
Creatinine, Ser: 1.14 mg/dL (ref 0.50–1.35)
GFR calc Af Amer: 71 mL/min — ABNORMAL LOW (ref >90)
GFR calc non Af Amer: 61 mL/min — ABNORMAL LOW (ref >90)
Glucose, Bld: 105 mg/dL — ABNORMAL HIGH (ref 70–99)
Potassium: 3.3 mEq/L — ABNORMAL LOW (ref 3.7–5.3)
SODIUM: 150 meq/L — AB (ref 137–147)

## 2013-08-31 LAB — URINE CULTURE
CULTURE: NO GROWTH
Colony Count: NO GROWTH

## 2013-08-31 LAB — CBC
HEMATOCRIT: 38.9 % — AB (ref 39.0–52.0)
HEMOGLOBIN: 12.8 g/dL — AB (ref 13.0–17.0)
MCH: 32.3 pg (ref 26.0–34.0)
MCHC: 32.9 g/dL (ref 30.0–36.0)
MCV: 98.2 fL (ref 78.0–100.0)
Platelets: 173 10*3/uL (ref 150–400)
RBC: 3.96 MIL/uL — ABNORMAL LOW (ref 4.22–5.81)
RDW: 13.3 % (ref 11.5–15.5)
WBC: 8.6 10*3/uL (ref 4.0–10.5)

## 2013-08-31 MED ORDER — ALBUTEROL SULFATE (2.5 MG/3ML) 0.083% IN NEBU: 2.5000 mg | INHALATION_SOLUTION | RESPIRATORY_TRACT | Status: DC

## 2013-08-31 NOTE — Progress Notes (Signed)
Physical Therapy Treatment Patient Details Name: Joseph Rice MRN: [id number] DOB: 19-Feb-1941 Today's Date: 08/31/2013 Time: 6962-9528 PT Time Calculation (min): 25 min  PT Assessment / Plan / Recommendation  History of Present Illness     PT Comments   Pt required 2+ (A) today for sit <> stand <> SPT. Pt had increased difficulties follow simple one step commands with max multimodal cues. Wife participated in transfer for family education.Continue to recommend SNF for safest D/C disposition; however, wife is adamantly refusing post acute rehab at this time and plans to take husband home. Family will need hoyer lift for transfers to ensure pt is being mobilized and for safety of pt and caregiver. Pt will also require (A) for transportation when D/c from hospital. Pt is not safe to mobilize in/out of car with wife at this time and is a high fall risk.  Follow Up Recommendations  Home health PT;Supervision/Assistance - 24 hour     Does the patient have the potential to tolerate intense rehabilitation     Barriers to Discharge        Equipment Recommendations  3in1 (PT);Hospital bed;Wheelchair (measurements PT);Wheelchair cushion (measurements PT);Other (comment) (hoyer lift )    Recommendations for Other Services OT consult  Frequency Min 4X/week   Progress towards PT Goals Progress towards PT goals: Not progressing toward goals - comment (decreased ability to follow commands; weakness  )  Plan Current plan remains appropriate    Precautions / Restrictions Precautions Precautions: Fall Precaution Comments: wife reports pt has fallen "couple times" at home  Restrictions Weight Bearing Restrictions: No   Pertinent Vitals/Pain No complaints during session.     Mobility  Bed Mobility Bed Mobility: Supine to Sit;Sitting - Scoot to Edge of Bed Supine to Sit: 2: Max assist;HOB elevated Sitting - Scoot to [employer] of Bed: 3: Mod assist Details for Bed Mobility Assistance: pt required max  (A) today to perform supine to sit; demo decreased ability to follow simple one step commands with multimodal cues; use of draw pad to facilitate movement to EOB  Transfers Transfers: Sit to Stand;Stand to Sit;Stand Pivot Transfers Sit to Stand: 1: +2 Total assist;From bed;With upper extremity assist Sit to Stand: Patient Percentage: 20% Stand to Sit: 1: +2 Total assist;To chair/3-in-1;With armrests;With upper extremity assist Stand to Sit: Patient Percentage: 20% Stand Pivot Transfers: 1: +2 Total assist;From elevated surface;With armrests Stand Pivot Transfers: Patient Percentage: 10% Details for Transfer Assistance: pt required 2+ (A) to transfer today; demo decreased ability to follow commands and participate; attempted sit to stand mulitple times; (A) to weight shift pt anteriorly and bring hips to extended position; pt unable to facilitate weightshift during transfer and required max facilitations to complete transfer; wife performed transfer with PT for family education; cued on technique and safety  Ambulation/Gait Ambulation/Gait Assistance: Not tested (comment) Stairs: No Wheelchair Mobility Wheelchair Mobility: No         PT Diagnosis:    PT Problem List:   PT Treatment Interventions:     PT Goals (current goals can now be found in the care plan section) Acute Rehab PT Goals Patient Stated Goal: none stated today PT Goal Formulation: With patient/family Time For Goal Achievement: 09/10/13 Potential to Achieve Goals: Fair  Visit Information  Last PT Received On: 08/31/13 Assistance Needed: +2    Subjective Data  Subjective: pt lying supine with wife present; wife stated "i just dont know how we can go home. he hasnt walked since you walked him tuesday" agreeable  to therapy  Patient Stated Goal: none stated today   Cognition  Cognition Arousal/Alertness: Awake/alert Behavior During Therapy: Flat affect Overall Cognitive Status: History of cognitive impairments - at  baseline (decr following commands today ) Memory: Decreased short-term memory    Balance  Balance Balance Assessed: Yes Static Sitting Balance Static Sitting - Balance Support: Feet supported;Bilateral upper extremity supported Static Sitting - Level of Assistance: 4: Min assist Static Sitting - Comment/# of Minutes: pt required (A) to maintain upright sitting position while at EOB; tolerated sitting ~ 8 min Static Standing Balance Static Standing - Balance Support: During functional activity;Bilateral upper extremity supported Static Standing - Level of Assistance: 1: +2 Total assist Static Standing - Comment/# of Minutes: 2+ (A) to stand pt while nurse tech performed perineal hygiene. pt was sitting in BM upon arrival   End of Session PT - End of Session Equipment Utilized During Treatment: Gait belt Activity Tolerance: Patient limited by fatigue Patient left: in chair;with call bell/phone within reach;with nursing/sitter in room;with family/visitor present Nurse Communication: Need for lift equipment;Mobility status;Precautions   GP     Elie Confer [place], [place] [phone] 08/31/2013, 11:20 AM

## 2013-08-31 NOTE — Progress Notes (Signed)
[facility]                                                                                Patient Demographics  Joseph Rice, is a 73 y.o. male, DOB - December 15, 1940, [id number]  Admit date - 08/26/2013   Admitting Physician Mervin Kung, MD  Outpatient Primary MD for the patient is Redge Gainer, MD  LOS - 5   Chief Complaint  Patient presents with  . Weakness    Poor intake      Interim history This is a 73 year old male with history of Parkinson's, hypertension, dementia, presents emergency department with altered mental status.  Essentially his workup has been somewhat negative. He was found to have urinary obstruction, Foley catheter was placed, urology was also consulted. Currently pending lift for discharge. Physical therapy and occupational therapy worked with patient.  Assessment & Plan    Principal Problem:   Dehydration Active Problems:   HYPERTENSION   Parkinson's disease   Hypokalemia   Acute encephalopathy   AKI (acute kidney injury)   Fever, unspecified  Urinary Retention/Acute kidney Injury with hypernatremia/dehydration -Improving, Cr 1.14 -Renal US: Mild hydronephrosis on the right kidney, dilated urinary bladder, concerned of bladder outlet obstruction -Urology consulted, recommended Foley catheter insertion, and starting Flomax -Patient sees Dr. Diona Fanti, outpatient -will hold nephrotoxic agents -retention may be secondary to Parkinson's versus BPH versus immobility  Acute encephalopathy -Improving, Likely secondary to sedating medications versus dehydration -May also be secondary to Parkinson's disease -TSH 2.205, vitamin B12 345, folate 446 -Per wife, patient is at his baseline however becomes more confused in the evening time. -Continue to monitor -CT scan of the head showed no acute intracranial abnormality -UA essentially negative, chest x-ray showed no cardiopulmonary abnormality -blood cultures show no  growth  Fever -Will continue to monitor CBC -Previous UA, CXR were negative for infection -blood cultures showed no growth -influenza PCR negative -Likely secondary to Urinary obstruction  Parkinson's disease -Continue Sinemet IR and Exelon, Haldol  Hypokalemia -Resolved, will continue to monitor. -Magnesium level 1.7  Hypertension -Currently stable  Failure to thrive -MBS conducted, dysphagia 3 diet recommended -will restart diet  Left shoulder pain -Xray showed loose bodies within the joint -MRI recommended, however, wife did not wish to proceed  Vit B12 Deficiency -Given IV B12, will need to continue PO form  Microscopic Hematuria -Will continue to monitor -Will d/c lovenox  Deconditioning -Patient has been in the hospital for quite some time, physical therapy has been working with the patient -Will ask occupational therapy to also see the patient -Of note patient's wife was turned with physical therapy at times.   Code Status: DO NOT RESUSCITATE  Family Communication: Wife at bedside  Disposition Plan: Admitted.  Will continue to monitor.  Attempting to obtain hoyer lift for home use. Being arranged by case management. Would like to have physical therapy continue to work with the patient.   Procedures None  Consults  None  DVT Prophylaxis  SCDs   Lab Results  Component Value Date   PLT 173 08/31/2013    Medications  Scheduled Meds: . amLODipine  5 mg Oral Daily  . aspirin EC  81  mg Oral Daily  . carbidopa-levodopa  0.5 tablet Oral TID  . feeding supplement (ENSURE)  1 Container Oral BID BM  . haloperidol lactate  2.5 mg Intravenous Once  . hydrALAZINE  10 mg Intravenous Once  . mirtazapine  15 mg Oral QHS  . QUEtiapine  100 mg Oral QHS  . tamsulosin  0.4 mg Oral Daily   Continuous Infusions:   PRN Meds:.acetaminophen, ALPRAZolam, ALPRAZolam  Antibiotics   Anti-infectives   Start     Dose/Rate Route Frequency Ordered Stop   08/26/13 1030   ciprofloxacin (CIPRO) IVPB 400 mg     400 mg 200 mL/hr over 60 Minutes Intravenous  Once 08/26/13 1029 08/26/13 1312       Time Spent in minutes   30 minutes   Stephon Weathers D.O. on 08/31/2013 at 1:45 PM  Between 7am to 7pm - Pager - [phone]  After 7pm go to [email or web address] - password [email or web address]  And look for the night coverage person covering for me after hours  [facility] Office  [phone]    Subjective:   Joseph Rice seen and examined today.  Currently resting comfortably with wife at bedside.  No complaints at this time.     Objective:   Filed Vitals:   08/30/13 2007 08/31/13 0555 08/31/13 1101 08/31/13 1319  BP: 131/69 119/67 118/72 146/83  Pulse: 99 113 104 97  Temp: 100.8 F (38.2 C) 100 F (37.8 C) 98.2 F (36.8 C) 98.1 F (36.7 C)  TempSrc: Oral Oral Oral Oral  Resp: 16 16 16 16   Height:      Weight: 86.8 kg (191 lb 5.8 oz)     SpO2: 94% 94% 94% 96%    Wt Readings from Last 3 Encounters:  08/30/13 86.8 kg (191 lb 5.8 oz)  08/12/13 88.905 kg (196 lb)  08/01/13 87.544 kg (193 lb)     Intake/Output Summary (Last 24 hours) at 08/31/13 1345 Last data filed at 08/31/13 0555  Gross per 24 hour  Intake 156.25 ml  Output   2075 ml  Net -1918.75 ml    Exam  General: Well developed, well nourished, NAD, appears stated age  HEENT: NCAT, PERRLA, EOMI, Anicteic Sclera, mucous membranes moist.   Neck: Supple, no JVD, no masses  Cardiovascular: S1 S2 auscultated, no rubs, murmurs or gallops. Regular rate and rhythm.  Respiratory: Clear to auscultation bilaterally with equal chest rise  Abdomen: Soft, tender to palpation, nondistended, + bowel sounds  Extremities: warm without cyanosis clubbing or edema  Neuro: Awake and alert. Has some cogwheel rigidity. Able to answer some questions.  Skin: Without rashes exudates or nodules  Data Review   Micro Results Recent Results (from the past 240 hour(s))  CULTURE, BLOOD (ROUTINE  X 2)     Status: None   Collection Time    08/29/13  1:44 PM      Result Value Range Status   Specimen Description BLOOD RIGHT ARM   Final   Special Requests     Final   Value: BOTTLES DRAWN AEROBIC AND ANAEROBIC 10CC AER,8CC ANA   Culture  Setup Time     Final   Value: 08/30/2013 01:28     Performed at [facility]   Culture     Final   Value:        BLOOD CULTURE RECEIVED NO GROWTH TO DATE CULTURE WILL BE HELD FOR 5 DAYS BEFORE ISSUING A FINAL NEGATIVE REPORT     Performed at [facility]   Report Status PENDING   Incomplete  CULTURE, BLOOD (ROUTINE X 2)     Status: None   Collection Time    08/29/13  1:52 PM      Result Value Range Status   Specimen Description BLOOD RIGHT HAND   Final   Special Requests BOTTLES DRAWN AEROBIC AND ANAEROBIC 10CC   Final   Culture  Setup Time     Final   Value: 08/30/2013 01:26     Performed at [facility]   Culture     Final   Value:        BLOOD CULTURE RECEIVED NO GROWTH TO DATE CULTURE WILL BE HELD FOR 5 DAYS BEFORE ISSUING A FINAL NEGATIVE REPORT     Performed at [facility]   Report Status PENDING   Incomplete    Radiology Reports Dg Chest 1 View  08/26/2013   CLINICAL DATA:  73 year old male with shortness of breath. Weakness. Initial encounter.  EXAM: CHEST - 1 VIEW  COMPARISON:  08/05/2013 and earlier.  FINDINGS: Portable AP semi upright view at 1023 hrs. Mildly lower lung volumes. Stable cardiac size and mediastinal contours. Allowing for portable technique, the lungs are clear. Advanced degenerative osseous changes at both shoulders. No pneumothorax.  IMPRESSION: Low lung volumes, otherwise no acute cardiopulmonary abnormality.   Electronically Signed   By: Lars Pinks M.D.   On: 08/26/2013 10:43   Dg Chest 2 View  08/02/2013   CLINICAL DATA:  Confusion.  EXAM: CHEST  2 VIEW  COMPARISON:  May 09, 2007.  FINDINGS: The heart size and mediastinal contours are within normal limits. No acute pulmonary  disease is noted. Small nodular density is seen projected over left lower lobe most likely representing nipple shadow. No pleural effusion or pneumothorax is noted. Mild degenerative disease is seen involving the mid thoracic spine. Degenerative changes are seen involving both glenohumeral joints.  IMPRESSION: Small nodular density seen projected over left lower lobe most likely representing overlying nipple shadow; repeat radiograph with nipple markers is recommended to rule out pulmonary nodule. No other significant abnormality is noted.   Electronically Signed   By: Sabino Dick M.D.   On: 08/02/2013 08:55   Ct Head Wo Contrast  08/26/2013   CLINICAL DATA:  Weakness. Poor oral intake. Unable to get out of bed.  EXAM: CT HEAD WITHOUT CONTRAST  TECHNIQUE: Contiguous axial images were obtained from the base of the skull through the vertex without intravenous contrast.  COMPARISON:  CT head without contrast 08/05/2013.  FINDINGS: No acute cortical infarct, hemorrhage, or mass lesion is present. Moderate generalized atrophy and white matter disease is stable. The ventricles are proportionate to the degree of atrophy. No significant extra-axial fluid collection is present.  The paranasal sinuses branch that and anterior left ethmoid air cell is opacified. Minimal mucosal thickening is noted in the left maxillary sinus. The paranasal sinuses and mastoid air cells are otherwise clear.  IMPRESSION: 1. Stable atrophy and white matter disease. 2. No acute intracranial abnormality. 3. Minimal sinus disease.   Electronically Signed   By: Lawrence Santiago M.D.   On: 08/26/2013 10:08   Ct Head Wo Contrast  08/05/2013   CLINICAL DATA:  Weakness, confusion  EXAM: CT HEAD WITHOUT CONTRAST  TECHNIQUE: Contiguous axial images were obtained from the base of the skull through the vertex without contrast.  COMPARISON:  01/14/2011  FINDINGS: Diffuse brain atrophy noted with minor white matter microvascular ischemic changes. Slight  ventricular  enlargement. No acute intracranial hemorrhage, infarction, mass lesion, midline shift, herniation, hydrocephalus, or extra-axial fluid collection. No definite cerebellar abnormality. Cisterns are patent. Mastoids and sinuses clear. No skull abnormality.  IMPRESSION: Mild brain atrophy.  No acute finding by noncontrast CT.   Electronically Signed   By: Daryll Brod M.D.   On: 08/05/2013 18:33   Ct Abdomen Pelvis W Contrast  08/26/2013   CLINICAL DATA:  Loss of appetite.  Weakness.  EXAM: CT ABDOMEN AND PELVIS WITH CONTRAST  TECHNIQUE: Multidetector CT imaging of the abdomen and pelvis was performed using the standard protocol following bolus administration of intravenous contrast.  CONTRAST:  120mL OMNIPAQUE IOHEXOL 300 MG/ML  SOLN  COMPARISON:  08/05/2011  FINDINGS: There is a small amount of pleural fluid on the right with minimal dependent atelectasis. No pericardial fluid. There is extensive coronary artery calcification.  There is artifact related to the patient's law arms.  The liver appears normal. No calcified gallstones. The spleen is normal. The pancreas is normal. The adrenal glands are normal. There are a few small renal cysts but no worrisome renal lesion. No stone or obstruction. The aorta shows atherosclerosis but no aneurysm. The IVC is unremarkable. No retroperitoneal mass or adenopathy. No free intraperitoneal fluid or air. The prostate gland is markedly enlarged. The bladder shows wall thickening. There are no enlarged pelvic lymph nodes.  No primary bowel pathology is seen.  There is advanced chronic degenerative disease in the lower lumbar spine.  IMPRESSION: Small amount of pleural fluid on the right with mild dependent pulmonary atelectasis.  Extensive coronary artery calcification.  Markedly enlarged prostate gland.  No abnormal lymph nodes seen.  Degenerative disease of the spine.   Electronically Signed   By: Nelson Chimes M.D.   On: 08/26/2013 14:24   Dg Chest Portable 1  View  08/05/2013   CLINICAL DATA:  Weakness, fall, hypertension  EXAM: PORTABLE CHEST - 1 VIEW  COMPARISON:  08/01/2013  FINDINGS: Low lung volumes. No focal pneumonia, collapse or consolidation. No edema, pleural fluid or pneumothorax. Normal heart size for poor inspiration. Degenerative changes of the spine and shoulders. Trachea is midline.  IMPRESSION: Low volume exam without acute process   Electronically Signed   By: Daryll Brod M.D.   On: 08/05/2013 18:44   Dg Shoulder Left  08/27/2013   CLINICAL DATA:  Shoulder pain  EXAM: LEFT SHOULDER - 2+ VIEW  COMPARISON:  None.  FINDINGS: There is severe narrowing and near complete obliteration of the glenohumeral joint. Deformity of the medial aspect of the humeral head with flattening is appreciated. Marked hypertrophic bone spurring is identified along the humeral head and glenoid. There also findings concerning for loose bodies within the glenohumeral joint. Areas of calcific tendonitis involving the supraspinatus tendon are also diagnostic consideration. There is no evidence of acute fracture nor dislocation.  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 There also findings concerning for loose bodies within the joint. Further evaluation with MRI, noncontrast in is recommended.   Electronically Signed   By: Margaree Mackintosh M.D.   On: 08/27/2013 11:27    CBC  Recent Labs Lab 08/26/13 1000 08/26/13 1832 08/28/13 0551 08/29/13 1344 08/30/13 0813 08/31/13 0805  WBC 10.1 10.0 9.0 12.3* 11.0* 8.6  HGB 17.9* 16.6 14.0 14.6 14.7 12.8*  HCT 50.9 47.4 41.1 43.3 43.6 38.9*  PLT 184 182 165 211 207 173  MCV 92.2 94.8 96.5 97.3 95.8 98.2  MCH 32.4 33.2 32.9 32.8 32.3 32.3  MCHC 35.2 35.0 34.1 33.7 33.7 32.9  RDW 12.8 13.0 13.1 13.4 13.5 13.3  LYMPHSABS 0.9  --   --   --   --   --   MONOABS 1.0  --   --   --   --   --   EOSABS 0.0  --   --   --   --   --   BASOSABS 0.0  --   --   --   --   --     Chemistries   Recent  Labs Lab 08/26/13 1000 08/26/13 1832 08/28/13 0551 08/30/13 0813 08/31/13 0805  NA 141  --  145 154* 150*  K 3.0*  --  4.4 3.8 3.3*  CL 99  --  111 116* 112  CO2 29  --  22 20 26   GLUCOSE 113*  --  106* 132* 105*  BUN 15  --  24* 43* 39*  CREATININE 1.03 1.12 0.85 1.51* 1.14  CALCIUM 9.3  --  8.6 9.3 8.5  MG  --  1.7  --   --   --   AST 14  --   --   --   --   ALT 9  --   --   --   --   ALKPHOS 84  --   --   --   --   BILITOT 1.1  --   --   --   --    ------------------------------------------------------------------------------------------------------------------ estimated creatinine clearance is 59.8 ml/min (by C-G formula based on Cr of 1.14). ------------------------------------------------------------------------------------------------------------------ No results found for this basename: HGBA1C,  in the last 72 hours ------------------------------------------------------------------------------------------------------------------ No results found for this basename: CHOL, HDL, LDLCALC, TRIG, CHOLHDL, LDLDIRECT,  in the last 72 hours ------------------------------------------------------------------------------------------------------------------ No results found for this basename: TSH, T4TOTAL, FREET3, T3FREE, THYROIDAB,  in the last 72 hours ------------------------------------------------------------------------------------------------------------------ No results found for this basename: VITAMINB12, FOLATE, FERRITIN, TIBC, IRON, RETICCTPCT,  in the last 72 hours  Coagulation profile No results found for this basename: INR, PROTIME,  in the last 168 hours  No results found for this basename: DDIMER,  in the last 72 hours  Cardiac Enzymes  Recent Labs Lab 08/26/13 1000  TROPONINI <0.30   ------------------------------------------------------------------------------------------------------------------ No components found with this basename: POCBNP,

## 2013-08-31 NOTE — Progress Notes (Signed)
   CARE MANAGEMENT NOTE 08/31/2013  Patient:  Joseph Rice, Joseph Rice   Account Number:  [id number]  Date Initiated:  08/28/2013  Documentation initiated by:  ROYAL,CHERYL  Subjective/Objective Assessment:   MD recommending HHRN, HHPT and DME. Pt will need hospital bed, wheelchair and 3:1.     Action/Plan:   Met with pt and wife, and discussed HH needs and DME. Will ask for orders for hospital bed, wheelchair and walker.  08/30/13 Met with wife DME to be delivered to room and home.   Anticipated DC Date:  08/30/2013   Anticipated DC Plan:  [place]         [facility] Choice  [place]   Choice offered to / List presented to:  C-3 Spouse   DME arranged  [place]  3-N-1      DME agency  [place] arranged  HH-1 RN  [place].   Status of service:  Completed, signed off Medicare Important Message given?   (If response is "NO", the following Medicare IM given date fields will be blank) Date Medicare IM given:   Date Additional Medicare IM given:    Discharge Disposition:  [place]  Per UR Regulation:    If discussed at Long Length of Stay Meetings, dates discussed:    Comments:  08/31/12 10:00 MD called to arrange for hoyer lift to be delivered along with bed to home.  [facility] made aware. Notification of discharge to [facility].  No other CM needs were communicated.  Mariane Masters, BSN, [place].  08/30/12 Met with pt and wife, spoke with [facility] and she will talk with wife to arrange home deliverys. Wheelchair and 3:1 will be delivered to room. Wife informed of this. Jasmine Pang RN MPH   08/28/2013 Met with pt and wife. Wife plans to continue care at home, however pt is unable to assist with lifting and sitting up at times, now with inablility to swallow. Pt will require hospital bed, needs wheelchair for transportation and 3:1  commode. CM will notify MD and place orders as needed. Jasmine Pang RN MPH, case manager, [phone]

## 2013-09-01 ENCOUNTER — Other Ambulatory Visit: Payer: Self-pay | Admitting: Family Medicine

## 2013-09-01 LAB — BASIC METABOLIC PANEL
BUN: 32 mg/dL — AB (ref 6–23)
CALCIUM: 8.4 mg/dL (ref 8.4–10.5)
CO2: 26 mEq/L (ref 19–32)
Chloride: 111 mEq/L (ref 96–112)
Creatinine, Ser: 0.96 mg/dL (ref 0.50–1.35)
GFR calc Af Amer: >90 mL/min (ref >90)
GFR calc non Af Amer: 80 mL/min — ABNORMAL LOW (ref >90)
GLUCOSE: 96 mg/dL (ref 70–99)
Potassium: 3 mEq/L — ABNORMAL LOW (ref 3.7–5.3)
SODIUM: 148 meq/L — AB (ref 137–147)

## 2013-09-01 LAB — CBC
HEMATOCRIT: 38.3 % — AB (ref 39.0–52.0)
HEMOGLOBIN: 12.7 g/dL — AB (ref 13.0–17.0)
MCH: 32.2 pg (ref 26.0–34.0)
MCHC: 33.2 g/dL (ref 30.0–36.0)
MCV: 97 fL (ref 78.0–100.0)
Platelets: 157 10*3/uL (ref 150–400)
RBC: 3.95 MIL/uL — AB (ref 4.22–5.81)
RDW: 13.1 % (ref 11.5–15.5)
WBC: 8.4 10*3/uL (ref 4.0–10.5)

## 2013-09-01 MED ORDER — TAMSULOSIN HCL 0.4 MG PO CAPS: 0.4000 mg | ORAL_CAPSULE | Freq: Every day | ORAL | Status: DC

## 2013-09-01 MED ORDER — POTASSIUM CHLORIDE CRYS ER 20 MEQ PO TBCR
40.0000 meq | EXTENDED_RELEASE_TABLET | Freq: Once | ORAL | Status: AC
  Administered 2013-09-01: 40 meq via ORAL
  Filled 2013-09-01: qty 2

## 2013-09-01 MED ORDER — POTASSIUM CHLORIDE CRYS ER 10 MEQ PO TBCR: 40.0000 meq | EXTENDED_RELEASE_TABLET | Freq: Every day | ORAL | Status: AC

## 2013-09-01 MED ORDER — ENSURE PUDDING PO PUDG: 1.0000 | Freq: Two times a day (BID) | ORAL | Status: AC

## 2013-09-01 NOTE — Discharge Summary (Signed)
PATIENT DETAILS Name: Joseph Rice Age: 73 y.o. Sex: male Date of Birth: 10/24/1940 MRN: [id number]. Admit Date: 08/26/2013 Admitting Physician: Mervin Kung, MD [id number], Elenore Rota, MD  Recommendations for Outpatient Follow-up:  1. Check BMET in one week. 2. Voiding trial with Urology in 2-3 weeks-has a foley catheter 3. Repeat UA in next 2-3 weeks to assess Microscopic Hematuria  PRIMARY DISCHARGE DIAGNOSIS:  Principal Problem:   Dehydration Active Problems:   HYPERTENSION   Parkinson's disease   Hypokalemia   Acute encephalopathy   AKI (acute kidney injury)   Fever, unspecified   Urinary retention      PAST MEDICAL HISTORY: Past Medical History  Diagnosis Date  . Other specified disorder of penis   . Other and unspecified hyperlipidemia   . Pain in joint, shoulder region   . Other chest pain   . Dry eyes   . Inguinal hernia   . Cataract   . Hypertension   . Parkinson's disease   . Cancer   . Memory disturbance 01/07/2013  . Dementia     DISCHARGE MEDICATIONS:   Medication List    STOP taking these medications       enalapril-hydrochlorothiazide 10-25 MG per tablet  Commonly known as:  VASERETIC      TAKE these medications       ALPRAZolam 1 MG tablet  Commonly known as:  XANAX  Take 1 tablet (1 mg total) by mouth at bedtime as needed for anxiety.     amLODipine 5 MG tablet  Commonly known as:  NORVASC  Take 5 mg by mouth daily.     aspirin 81 MG tablet  Take 81 mg by mouth at bedtime.     carbidopa-levodopa 25-100 MG per tablet  Commonly known as:  SINEMET IR  Take 0.5 tablets by mouth 3 (three) times daily.     Cholecalciferol 2000 UNITS Caps  Take 2,000 Units by mouth daily.     feeding supplement (ENSURE) Pudg  Take 1 Container by mouth 2 (two) times daily between meals.     meloxicam 15 MG tablet  Commonly known as:  MOBIC  Take 7.5 mg by mouth daily.     mirtazapine 15 MG tablet  Commonly known as:  REMERON  Take 15-30 mg  by mouth at bedtime.     Pitavastatin Calcium 4 MG Tabs  Commonly known as:  LIVALO  Take 1 tablet (4 mg total) by mouth at bedtime.     potassium chloride 10 MEQ tablet  Commonly known as:  K-DUR,KLOR-CON  Take 4 tablets (40 mEq total) by mouth daily.     QUEtiapine 50 MG tablet  Commonly known as:  SEROQUEL  Take 100 mg by mouth at bedtime.     rivastigmine 6 MG capsule  Commonly known as:  EXELON  Take 6 mg by mouth 2 (two) times daily.     tamsulosin 0.4 MG Caps capsule  Commonly known as:  FLOMAX  Take 1 capsule (0.4 mg total) by mouth daily.     traMADol 50 MG tablet  Commonly known as:  ULTRAM  Take 1 tablet (50 mg total) by mouth every 6 (six) hours as needed. For pain        ALLERGIES:   Allergies  Allergen Reactions  . Aricept [Donepezil Hydrochloride] Other (See Comments)    unknown  . Lipitor [Atorvastatin Calcium]     abd pain  . Lovaza [Omega-3-Acid Ethyl Esters] Other (See Comments)    Could not swallow  .  Morphine And Related     confusion  . Niacin-Simvastatin Er     aching  . Penicillins Rash    BRIEF HPI:  See H&P, Labs, Consult and Test reports for all details in brief, patient Joseph Rice is a 73 y.o. 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  CONSULTATIONS:   urology  PERTINENT RADIOLOGIC STUDIES: Dg Chest 1 View  08/26/2013   CLINICAL DATA:  73 year old male with shortness of breath. Weakness. Initial encounter.  EXAM: CHEST - 1 VIEW  COMPARISON:  08/05/2013 and earlier.  FINDINGS: Portable AP semi upright view at 1023 hrs. Mildly lower lung volumes. Stable cardiac size and mediastinal contours. Allowing for portable technique, the lungs are clear. Advanced degenerative osseous changes at both shoulders. No pneumothorax.  IMPRESSION: Low lung volumes, otherwise no  acute cardiopulmonary abnormality.   Electronically Signed   By: Lars Pinks M.D.   On: 08/26/2013 10:43   Ct Head Wo Contrast  08/26/2013   CLINICAL DATA:  Weakness. Poor oral intake. Unable to get out of bed.  EXAM: CT HEAD WITHOUT CONTRAST  TECHNIQUE: Contiguous axial images were obtained from the base of the skull through the vertex without intravenous contrast.  COMPARISON:  CT head without contrast 08/05/2013.  FINDINGS: No acute cortical infarct, hemorrhage, or mass lesion is present. Moderate generalized atrophy and white matter disease is stable. The ventricles are proportionate to the degree of atrophy. No significant extra-axial fluid collection is present.  The paranasal sinuses branch that and anterior left ethmoid air cell is opacified. Minimal mucosal thickening is noted in the left maxillary sinus. The paranasal sinuses and mastoid air cells are otherwise clear.  IMPRESSION: 1. Stable atrophy and white matter disease. 2. No acute intracranial abnormality. 3. Minimal sinus disease.   Electronically Signed   By: Lawrence Santiago M.D.   On: 08/26/2013 10:08   Ct Head Wo Contrast  08/05/2013   CLINICAL DATA:  Weakness, confusion  EXAM: CT HEAD WITHOUT CONTRAST  TECHNIQUE: Contiguous axial images were obtained from the base of the skull through the vertex without contrast.  COMPARISON:  01/14/2011  FINDINGS: Diffuse brain atrophy noted with minor white matter microvascular ischemic changes. Slight ventricular enlargement. No acute intracranial hemorrhage, infarction, mass lesion, midline shift, herniation, hydrocephalus, or extra-axial fluid collection. No definite cerebellar abnormality. Cisterns are patent. Mastoids and sinuses clear. No skull abnormality.  IMPRESSION: Mild brain atrophy.  No acute finding by noncontrast CT.   Electronically Signed   By: Daryll Brod M.D.   On: 08/05/2013 18:33   Ct Abdomen Pelvis W Contrast  08/26/2013   CLINICAL DATA:  Loss of appetite.  Weakness.  EXAM: CT  ABDOMEN AND PELVIS WITH CONTRAST  TECHNIQUE: Multidetector CT imaging of the abdomen and pelvis was performed using the standard protocol following bolus administration of intravenous contrast.  CONTRAST:  154mL OMNIPAQUE IOHEXOL 300 MG/ML  SOLN  COMPARISON:  08/05/2011  FINDINGS: There is a small amount of pleural fluid on the right with minimal dependent atelectasis. No pericardial fluid. There is extensive coronary artery calcification.  There is artifact related to the patient's law arms.  The liver appears normal. No calcified gallstones. The spleen is normal. The pancreas is normal. The adrenal glands are normal. There are a few small renal cysts but no worrisome renal lesion. No stone or obstruction. The aorta shows  atherosclerosis but no aneurysm. The IVC is unremarkable. No retroperitoneal mass or adenopathy. No free intraperitoneal fluid or air. The prostate gland is markedly enlarged. The bladder shows wall thickening. There are no enlarged pelvic lymph nodes.  No primary bowel pathology is seen.  There is advanced chronic degenerative disease in the lower lumbar spine.  IMPRESSION: Small amount of pleural fluid on the right with mild dependent pulmonary atelectasis.  Extensive coronary artery calcification.  Markedly enlarged prostate gland.  No abnormal lymph nodes seen.  Degenerative disease of the spine.   Electronically Signed   By: Nelson Chimes M.D.   On: 08/26/2013 14:24   US Renal  08/30/2013   CLINICAL DATA:  Elevated creatinine  EXAM: RENAL/URINARY TRACT ULTRASOUND COMPLETE  COMPARISON:  None.  FINDINGS: Right Kidney:  Length: 10.8 cm. Mild hydronephrosis appreciated within the right kidney. There is no evidence of solid or cystic masses, calculi. There is appropriate corticomedullary differentiation.  Left Kidney:  Length: 11.6 cm. Poorly visualize though, echogenicity within normal limits. No mass or hydronephrosis visualized.  Bladder:  The urinary bladder is dilated, filled with urine. A  Foley catheter is not clearly identified.  IMPRESSION: 1. Mild hydronephrosis within the right kidney. This appears be secondary to a dilated urinary bladder. Urinary bladder findings raise concern of bladder outlet obstruction . A Foley catheter and is not appreciated within the urinary bladder. These results were called by telephone at the time of interpretation on 08/30/2013 at 4:23 PM to Tora Kindred the patient's floor nurse, who verbally acknowledged these results. And will relay these results to the patient's attending physician Dr. Ree Kida.   Electronically Signed   By: Margaree Mackintosh M.D.   On: 08/30/2013 16:25   Dg Chest Port 1 View  08/29/2013   CLINICAL DATA:  fever,  EXAM: PORTABLE CHEST - 1 VIEW  COMPARISON:  DG CHEST 1 VIEW dated 08/26/2013  FINDINGS: Normal cardiac silhouette. There is a fine interstitial pattern which is increased compared to prior. Low lung volumes.  IMPRESSION: Increased fine interstitial pattern suggest interstitial edema. No focal consolidation.  Low lung volumes limit evaluation of the lung bases.   Electronically Signed   By: Suzy Bouchard M.D.   On: 08/29/2013 16:48   Dg Chest Portable 1 View  08/05/2013   CLINICAL DATA:  Weakness, fall, hypertension  EXAM: PORTABLE CHEST - 1 VIEW  COMPARISON:  08/01/2013  FINDINGS: Low lung volumes. No focal pneumonia, collapse or consolidation. No edema, pleural fluid or pneumothorax. Normal heart size for poor inspiration. Degenerative changes of the spine and shoulders. Trachea is midline.  IMPRESSION: Low volume exam without acute process   Electronically Signed   By: Daryll Brod M.D.   On: 08/05/2013 18:44   Dg Shoulder Left  08/27/2013   CLINICAL DATA:  Shoulder pain  EXAM: LEFT SHOULDER - 2+ VIEW  COMPARISON:  None.  FINDINGS: There is severe narrowing and near complete obliteration of the glenohumeral joint. Deformity of the medial aspect of the humeral head with flattening is appreciated. Marked hypertrophic bone  spurring is identified along the humeral head and glenoid. There also findings concerning for loose bodies within the glenohumeral joint. Areas of calcific tendonitis involving the supraspinatus tendon are also diagnostic consideration. There is no evidence of acute fracture nor dislocation.  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 There also findings concerning for loose bodies within the joint. Further evaluation with MRI, noncontrast in is recommended.   Electronically Signed   By:  Margaree Mackintosh M.D.   On: 08/27/2013 11:27   Dg Swallowing Func-speech Pathology  08/28/2013   Germain Osgood, CCC-SLP     08/28/2013  2:44 PM Objective Swallowing Evaluation: Modified Barium Swallowing Study   Patient Details  Name: Joseph Rice MRN: [id number] Date of Birth: 1941/08/22  Today's Date: 08/28/2013 Time: 1353-1410 SLP Time Calculation (min): 17 min  Past Medical History:  Past Medical History  Diagnosis Date  . Other specified disorder of penis   . Other and unspecified hyperlipidemia   . Pain in joint, shoulder region   . Other chest pain   . Dry eyes   . Inguinal hernia   . Cataract   . Hypertension   . Parkinson's disease   . Cancer   . Memory disturbance 01/07/2013  . Dementia    Past Surgical History:  Past Surgical History  Procedure Laterality Date  . Back surgery    . Cataract extraction, bilateral    . Shoulder surgery Left   . Lumbar spine surgery     HPI:  Nyquan Selbe is a 73 y.o. 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 All hx per wife and chart  review. On multiple sedating meds and recently started remeron  for wandering/not sleeping at night. Also, wife was giving  patient benadryl at night to keep him sedated until 3 nights ago.  Usually, able to walk on his own, goes out to lunch and shopping  with wife. Extensive Workup in ED only significant for potassium  of 3.0 and  microscopic hematuria. Wife provides 24 hour care and  is not interested in placement. Wife denies any h/o dysphagia and  PNA.     Assessment / Plan / Recommendation Clinical Impression  Dysphagia Diagnosis: Mild oral phase dysphagia;Mild pharyngeal  phase dysphagia Clinical impression: Pt presents with a mild sensory-motor based  dysphagia characterized by decreased lingual manipulation and  awareness resulting in decreased bolus cohesion/containment.  Premature spillage occurs to the level of the vallecular with  small sips of thin liquid as well as solid POs, however with  large sips premature spillage occurs to the pyriform sinuses and  results in frank penetration above the vocal cords. Pt was able  to clear penetrates with cued cough, however pt with decreased  ability to follow commands consistently. Recommend that pt  initiate Dys 3 textures and thin liquids with full supervision to  ensure small bites/sips.     Treatment Recommendation  Therapy as outlined in treatment plan below    Diet Recommendation Dysphagia 3 (Mechanical Soft);Thin liquid   Liquid Administration via: Cup;Straw Medication Administration: Whole meds with puree Supervision: Staff to assist with self feeding;Full  supervision/cueing for compensatory strategies Compensations: Slow rate;Small sips/bites Postural Changes and/or Swallow Maneuvers: Seated upright 90  degrees    Other  Recommendations Oral Care Recommendations: Oral care BID   Follow Up Recommendations  None    Frequency and Duration min 2x/week  1 week   Pertinent Vitals/Pain N/A    SLP Swallow Goals     General Date of Onset: 08/26/13 HPI: Joseph Rice is a 73 y.o. 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 All hx per wife and chart  review. On multiple sedating meds and recently started remeron  for wandering/not sleeping at night. Also, wife was giving  patient benadryl at night to  keep him sedated until 3 nights ago.  Usually, able to walk on his own, goes out to lunch and shopping  with wife. Extensive Workup in ED only significant for potassium  of 3.0 and microscopic hematuria. Wife provides 24 hour care and  is not interested in placement. Wife denies any h/o dysphagia and  PNA. Type of Study: Modified Barium Swallowing Study Reason for Referral: Objectively evaluate swallowing function Previous Swallow Assessment: clinical swallow eval completed  12/30 with recommendations for NPO pending objective testing Diet Prior to this Study: NPO Temperature Spikes Noted: Yes (low grade) Respiratory Status: Room air History of Recent Intubation: No Behavior/Cognition: Alert;Confused;Requires cueing Oral Cavity - Dentition: Adequate natural dentition Self-Feeding Abilities: Needs assist Patient Positioning: Upright in chair Baseline Vocal Quality: Low vocal intensity Volitional Cough: Cognitively unable to elicit Volitional Swallow: Unable to elicit Anatomy: Within functional limits Pharyngeal Secretions: Not observed secondary MBS    Reason for Referral Objectively evaluate swallowing function   Oral Phase Oral Preparation/Oral Phase Oral Phase: Impaired Oral - Thin Oral - Thin Straw: Weak lingual manipulation;Lingual  pumping;Other (Comment) (decreased bolus cohesion/containment) Oral - Solids Oral - Puree: Weak lingual manipulation;Lingual pumping;Reduced  posterior propulsion;Delayed oral transit;Other  (Comment);Lingual/palatal residue (decreased bolus  containment/cohesion) Oral - Mechanical Soft: Weak lingual manipulation;Lingual  pumping;Delayed oral transit;Other (Comment);Lingual/palatal  residue (decreased bolus cohesion/containment)   Pharyngeal Phase Pharyngeal Phase Pharyngeal Phase: Impaired Pharyngeal - Thin Pharyngeal - Thin Straw: Premature spillage to pyriform  sinuses;Penetration/Aspiration before swallow;Reduced tongue base  retraction Penetration/Aspiration details (thin  straw): Material enters  airway, remains ABOVE vocal cords and not ejected out Pharyngeal - Solids Pharyngeal - Puree: Premature spillage to valleculae;Reduced  tongue base retraction;Pharyngeal residue - valleculae Pharyngeal - Mechanical Soft: Premature spillage to  valleculae;Reduced tongue base retraction;Pharyngeal residue -  valleculae  Cervical Esophageal Phase    GO    Cervical Esophageal Phase Cervical Esophageal Phase: [facility]         Germain Osgood, M.A. CCC-SLP [phone]  Germain Osgood 08/28/2013, 2:44 PM      PERTINENT LAB RESULTS: CBC:  Recent Labs  08/31/13 0805 09/01/13 0640  WBC 8.6 8.4  HGB 12.8* 12.7*  HCT 38.9* 38.3*  PLT 173 157   CMET CMP     Component Value Date/Time   NA 148* 09/01/2013 0640   NA 143 08/12/2013 1253   K 3.0* 09/01/2013 0640   CL 111 09/01/2013 0640   CO2 26 09/01/2013 0640   GLUCOSE 96 09/01/2013 0640   GLUCOSE 86 08/12/2013 1253   BUN 32* 09/01/2013 0640   BUN 19 08/12/2013 1253   CREATININE 0.96 09/01/2013 0640   CREATININE 1.11 12/25/2012 1135   CALCIUM 8.4 09/01/2013 0640   PROT 7.3 08/26/2013 1000   PROT 7.0 08/01/2013 1548   ALBUMIN 3.6 08/26/2013 1000   AST 14 08/26/2013 1000   ALT 9 08/26/2013 1000   ALKPHOS 84 08/26/2013 1000   BILITOT 1.1 08/26/2013 1000   GFRNONAA 80* 09/01/2013 0640   GFRAA >90 09/01/2013 0640    GFR Estimated Creatinine Clearance: 71 ml/min (by C-G formula based on Cr of 0.96). No results found for this basename: LIPASE, AMYLASE,  in the last 72 hours  Recent Labs  08/30/13 1300  CKTOTAL 92   No components found with this basename: POCBNP,  No results found for this basename: DDIMER,  in the last 72 hours No results found for this basename: HGBA1C,  in the last 72 hours No results found  for this basename: CHOL, HDL, LDLCALC, TRIG, CHOLHDL, LDLDIRECT,  in the last 72 hours No results found for this basename: TSH, T4TOTAL, FREET3, T3FREE, THYROIDAB,  in the last 72 hours No results found for this basename:  VITAMINB12, FOLATE, FERRITIN, TIBC, IRON, RETICCTPCT,  in the last 72 hours Coags: No results found for this basename: PT, INR,  in the last 72 hours Microbiology: Recent Results (from the past 240 hour(s))  CULTURE, BLOOD (ROUTINE X 2)     Status: None   Collection Time    08/29/13  1:44 PM      Result Value Range Status   Specimen Description BLOOD RIGHT ARM   Final   Special Requests     Final   Value: BOTTLES DRAWN AEROBIC AND ANAEROBIC 10CC AER,8CC ANA   Culture  Setup Time     Final   Value: 08/30/2013 01:28     Performed at [facility]   Culture     Final   Value:        BLOOD CULTURE RECEIVED NO GROWTH TO DATE CULTURE WILL BE HELD FOR 5 DAYS BEFORE ISSUING A FINAL NEGATIVE REPORT     Performed at [facility]   Report Status PENDING   Incomplete  CULTURE, BLOOD (ROUTINE X 2)     Status: None   Collection Time    08/29/13  1:52 PM      Result Value Range Status   Specimen Description BLOOD RIGHT HAND   Final   Special Requests BOTTLES DRAWN AEROBIC AND ANAEROBIC 10CC   Final   Culture  Setup Time     Final   Value: 08/30/2013 01:26     Performed at [facility]   Culture     Final   Value:        BLOOD CULTURE RECEIVED NO GROWTH TO DATE CULTURE WILL BE HELD FOR 5 DAYS BEFORE ISSUING A FINAL NEGATIVE REPORT     Performed at [facility]   Report Status PENDING   Incomplete  URINE CULTURE     Status: None   Collection Time    08/30/13  6:24 PM      Result Value Range Status   Specimen Description URINE, CATHETERIZED   Final   Special Requests NONE   Final   Culture  Setup Time     Final   Value: 08/30/2013 20:30     Performed at [place]     Final   Value: NO GROWTH     Performed at [facility]   Culture     Final   Value: NO GROWTH     Performed at [facility]   Report Status 08/31/2013 FINAL   Final     BRIEF HOSPITAL COURSE:   Acute Urinary Retention: -multifactorial -  parkinson's, BPH, failure to thrive -foley placed, started on flomax -Urology consulted, plan for outpatient voiding trial by Urology in 2-3 weeks.  Acute Renal failure -multifactorial-BPH/Urinary Retention/Dehydrations -resolved with IVF, Foley catheter placement  Hypernatremia -better with Hydration -Almost back to normal at discharge with IVF -spouse anxious to take patient home today, I have encouraged generous hydration, and recheck lytes in one week at PCP's. She understands. -Unfortunately-patient with Failure to Thrive, not much appetite, per RN finishing around 75 percent of his meals  Hypokalemia -replete today, will place on KCL 10 meQ on discharge. Recheck lytes in one week. -Would not resume HCTZ in the  future  Acute encephalopathy  -Improving, Likely secondary to sedating medications versus dehydration -CT scan of the head showed no acute intracranial abnormality -No evidence of infection-although febrile, but with neg blood and urine cultures -Resolved-back to baseline.  Febrile Illness -fever during initial few days of hospitalization-none for 48 hours -Negative blood and urine cultures,influenza PCR negative.CXR did not show PNA -monitored off antibiotics  Hypertension -hold HCTZ/Lisinopril -BP controlled with Amlodipine-continue on discharge.  Left shoulder pain  -Xray showed loose bodies within the joint  -MRI recommended, however, wife did not wish to proceed -supportive care  Parkinson's disease  -Continue Sinemet IR, seroquel  and Exelon on discharge  Deconditioning -spouse refused SNF-still not back to baseline. HHPT/OT arranged.  Microscopic Hematuria  -Will continue to monitor -outpatient Urology follow up -repeat UA in next 2-3 weeks  TODAY-DAY OF DISCHARGE:  Subjective:   Joseph Rice today has no headache,no chest abdominal pain,no new weakness tingling or numbness, feels much better wants to go home today. Spouse at bedside, thinks  she can manage to take care of patient in the outpatient setting.  Objective:   Blood pressure 114/75, pulse 81, temperature 98.2 F (36.8 C), temperature source Oral, resp. rate 18, height 5\' 7"  (1.702 m), weight 86.8 kg (191 lb 5.8 oz), SpO2 95.00%.  Intake/Output Summary (Last 24 hours) at 09/01/13 1145 Last data filed at 09/01/13 0850  Gross per 24 hour  Intake    480 ml  Output   1550 ml  Net  -1070 ml   Filed Weights   08/26/13 1831 08/28/13 2205 08/30/13 2007  Weight: 85.458 kg (188 lb 6.4 oz) 86.909 kg (191 lb 9.6 oz) 86.8 kg (191 lb 5.8 oz)    Exam Awake Alert, Oriented *3, No new F.N deficits, Normal affect. Resting tremors. Follows commands, responds appropriately Coin.AT,PERRAL Supple Neck,No JVD, No cervical lymphadenopathy appriciated.  Symmetrical Chest wall movement, Good air movement bilaterally, CTAB RRR,No Gallops,Rubs or new Murmurs, No Parasternal Heave +ve B.Sounds, Abd Soft, Non tender, No organomegaly appriciated, No rebound -guarding or rigidity. No Cyanosis, Clubbing or edema, No new Rash or bruise  DISCHARGE CONDITION: Stable  DISPOSITION: Home with home health services  DISCHARGE INSTRUCTIONS:    Activity:  As tolerated with Full fall precautions use walker/cane & assistance as needed  Diet recommendation: Heart Healthy diet Dysphagia 3 diet with thin liquids-spouse accepting all risks      Discharge Orders   Future Appointments Provider Department Dept Phone   02/07/2014 10:00 AM Kathrynn Ducking, MD [facility] [phone]   Future Orders Complete By Expires   Call MD for:  persistant nausea and vomiting  As directed    Diet - low sodium heart healthy  As directed    Increase activity slowly  As directed       Follow-up Information   Call Jorja Loa, MD. (f/u 2-3 weeks)    Specialty:  Urology   Contact information:   [facility]  [place]  [phone]       Follow up with Redge Gainer, MD. Schedule an appointment as soon as possible for a visit in 1 week.   Specialty:  Family Medicine   Contact information:   [place] [address] [phone]      Total Time spent on discharge equals 45 minutes.  SignedOren Binet 09/01/2013 11:45 AM

## 2013-09-01 NOTE — Progress Notes (Signed)
Pt discharged home. [place] rescue squad transported pt. Discharge instructions provided to wife with explanation and teach back. Good understanding. NSL removed with cath intact. Foley intact. Leg bag provided with teach back. Manya Silvas, RN

## 2013-09-01 NOTE — Plan of Care (Signed)
Problem: Phase I Progression Outcomes Goal: Voiding-avoid urinary catheter unless indicated Outcome: Not Met (add Reason) Pt discharged with indwellilng catheter. Flomax initiated. Pt to f/u with urologist about urinary retention.

## 2013-09-01 NOTE — Plan of Care (Signed)
Problem: Phase III Progression Outcomes Goal: Voiding independently Outcome: Not Met (add Reason) Urinary catheter in place for retention.     

## 2013-09-03 ENCOUNTER — Other Ambulatory Visit: Payer: Self-pay | Admitting: *Deleted

## 2013-09-03 DIAGNOSIS — R339 Retention of urine, unspecified: Secondary | ICD-10-CM

## 2013-09-03 NOTE — Addendum Note (Signed)
Addended by: Ilean China on: 09/03/2013 04:35 PM   Modules accepted: Orders

## 2013-09-05 ENCOUNTER — Other Ambulatory Visit: Payer: Self-pay

## 2013-09-05 LAB — CULTURE, BLOOD (ROUTINE X 2)
Culture: NO GROWTH
Culture: NO GROWTH

## 2013-09-09 ENCOUNTER — Telehealth: Payer: Self-pay | Admitting: Family Medicine

## 2013-09-10 ENCOUNTER — Other Ambulatory Visit: Payer: Self-pay | Admitting: Family Medicine

## 2013-09-10 LAB — BASIC METABOLIC PANEL
BUN: 14 mg/dL (ref 6–23)
CO2: 31 mEq/L (ref 19–32)
Calcium: 9.2 mg/dL (ref 8.4–10.5)
Chloride: 100 mEq/L (ref 96–112)
Creat: 0.91 mg/dL (ref 0.50–1.35)
GLUCOSE: 135 mg/dL — AB (ref 70–99)
POTASSIUM: 4.2 meq/L (ref 3.5–5.3)
Sodium: 139 mEq/L (ref 135–145)

## 2013-09-16 ENCOUNTER — Telehealth: Payer: Self-pay | Admitting: Family Medicine

## 2013-09-16 NOTE — Telephone Encounter (Signed)
Message copied by Waverly Ferrari on Mon Sep 16, 2013  2:38 PM ------      Message from: Chipper Herb      Created: Fri Sep 13, 2013  1:52 PM       The blood sugar is elevated at 135. The kidney function and electrolytes are within normal limits including potassium ------

## 2013-09-16 NOTE — Telephone Encounter (Signed)
Patients wife aware

## 2013-09-17 ENCOUNTER — Telehealth: Payer: Self-pay | Admitting: *Deleted

## 2013-09-17 ENCOUNTER — Other Ambulatory Visit: Payer: Self-pay

## 2013-09-17 MED ORDER — TRAMADOL HCL 50 MG PO TABS: 50.0000 mg | ORAL_TABLET | Freq: Four times a day (QID) | ORAL | Status: DC | PRN

## 2013-09-17 NOTE — Telephone Encounter (Signed)
Dr. Dorma Russell is prior approved but for the pt it is still cost prohibitive, they wish for you to order something cheaper if possible because they can't afford the copay.  Gets meds at [facility].  Thanks for your attention.

## 2013-09-17 NOTE — Telephone Encounter (Signed)
I think that we should just hold the statin drug for now with all of the other issues that are going on and because of his past sensitivities to statin drugs. Please call the patient's wife and let her know this

## 2013-09-17 NOTE — Telephone Encounter (Signed)
Last seen 08/01/13  DWM If approved print and route to nurse

## 2013-09-17 NOTE — Telephone Encounter (Signed)
Called pts wife and told her what Dr. Laurance Flatten advised she said she would give him rest of samples she has and leave off till further notice.

## 2013-09-18 ENCOUNTER — Telehealth: Payer: Self-pay | Admitting: Family Medicine

## 2013-09-18 NOTE — Telephone Encounter (Signed)
Wife notified and verbalized understanding.  

## 2013-09-18 NOTE — Telephone Encounter (Signed)
Wife wants him to keep the urinary catheter and not have it removed. Can you okay this or does he need to keep appt with dr. Shona Needles on Friday? Wife said it is hard to get him in vehicle and travel and they haven't seen him in their office in over year. The hospital place urinary catheter.

## 2013-09-18 NOTE — Telephone Encounter (Signed)
She needs to keep the appointment with the urologist as he has an understanding of all the issues are going on with the patient's bladder and whether it should be removed or In place

## 2013-09-19 NOTE — Telephone Encounter (Signed)
Wife called today - and we are trying to get it approved for pt to see de wrenn in [place].

## 2013-09-20 ENCOUNTER — Telehealth: Payer: Self-pay | Admitting: Family Medicine

## 2013-09-25 ENCOUNTER — Other Ambulatory Visit: Payer: Self-pay | Admitting: Internal Medicine

## 2013-09-25 NOTE — Telephone Encounter (Signed)
[facility]ed [facility] 

## 2013-09-27 ENCOUNTER — Other Ambulatory Visit: Payer: Self-pay | Admitting: *Deleted

## 2013-09-27 MED ORDER — TAMSULOSIN HCL 0.4 MG PO CAPS: 0.4000 mg | ORAL_CAPSULE | Freq: Every day | ORAL | Status: DC

## 2013-09-28 DIAGNOSIS — R627 Adult failure to thrive: Secondary | ICD-10-CM

## 2013-09-28 DIAGNOSIS — F0390 Unspecified dementia without behavioral disturbance: Secondary | ICD-10-CM

## 2013-09-28 DIAGNOSIS — I10 Essential (primary) hypertension: Secondary | ICD-10-CM

## 2013-09-28 DIAGNOSIS — G20 Parkinson's disease: Secondary | ICD-10-CM

## 2013-09-30 ENCOUNTER — Telehealth: Payer: Self-pay | Admitting: Family Medicine

## 2013-10-01 NOTE — Telephone Encounter (Signed)
Wife doesn't feel that he is able to come in for an appointment.  He doesn't ambulate well and tires easily. He is transported by EMS to appointments.  Physical therapist noticed that left forearm is swollen and there is some bruising. No redness or heat. Patient has pain in both arms at all times.  His wife has elevated the arm and placed ice on it. Home health nurse is due to visit tomorrow. I will contact her to see if she can go by today. My concern is DVT although cellulitis or soft tissue damage are also possible.  Patient takes an aspirin 81mg  daily but no other other anticoagulants.   Spoke with Kenney Houseman, [facility] Nurse. She will go by and see him early in the morning. He has contractures and involuntary arm movements. It is likely that he struck his arm on a bed rail, etc which would explain the bruising and swelling.  She will update [place] after her visit tomorrow.   Discussed with wife.

## 2013-10-08 ENCOUNTER — Ambulatory Visit (INDEPENDENT_AMBULATORY_CARE_PROVIDER_SITE_OTHER): Payer: Medicare HMO | Admitting: Urology

## 2013-10-08 DIAGNOSIS — N138 Other obstructive and reflux uropathy: Secondary | ICD-10-CM

## 2013-10-08 DIAGNOSIS — N401 Enlarged prostate with lower urinary tract symptoms: Secondary | ICD-10-CM

## 2013-10-08 DIAGNOSIS — R338 Other retention of urine: Secondary | ICD-10-CM

## 2013-10-09 ENCOUNTER — Telehealth: Payer: Self-pay | Admitting: *Deleted

## 2013-10-09 NOTE — Telephone Encounter (Signed)
Please arrange for [place] to do this as soon as possible

## 2013-10-09 NOTE — Telephone Encounter (Signed)
Per Kenney Houseman at [facility], pt needs posey socks for elbows and he needs nystatin powder for yeast around penile area. Also, wife wants a home visit asap

## 2013-10-10 ENCOUNTER — Ambulatory Visit (INDEPENDENT_AMBULATORY_CARE_PROVIDER_SITE_OTHER): Payer: Medicare HMO | Admitting: Family Medicine

## 2013-10-10 ENCOUNTER — Encounter: Payer: Self-pay | Admitting: Family Medicine

## 2013-10-10 VITALS — BP 126/77 | HR 84 | Temp 97.8°F | Ht 67.0 in

## 2013-10-10 DIAGNOSIS — D518 Other vitamin B12 deficiency anemias: Secondary | ICD-10-CM

## 2013-10-10 DIAGNOSIS — R413 Other amnesia: Secondary | ICD-10-CM

## 2013-10-10 DIAGNOSIS — F079 Unspecified personality and behavioral disorder due to known physiological condition: Secondary | ICD-10-CM

## 2013-10-10 DIAGNOSIS — R339 Retention of urine, unspecified: Secondary | ICD-10-CM

## 2013-10-10 DIAGNOSIS — Z87898 Personal history of other specified conditions: Secondary | ICD-10-CM

## 2013-10-10 DIAGNOSIS — G20 Parkinson's disease: Secondary | ICD-10-CM

## 2013-10-10 DIAGNOSIS — I10 Essential (primary) hypertension: Secondary | ICD-10-CM

## 2013-10-10 DIAGNOSIS — M25519 Pain in unspecified shoulder: Secondary | ICD-10-CM

## 2013-10-10 DIAGNOSIS — E785 Hyperlipidemia, unspecified: Secondary | ICD-10-CM

## 2013-10-10 MED ORDER — NYSTATIN 100000 UNIT/GM EX POWD: CUTANEOUS | Status: DC

## 2013-10-10 NOTE — Progress Notes (Signed)
Subjective:    Patient ID: Joseph Rice, male    DOB: July 11, 1941, 73 y.o.   MRN: [id number]  HPI Pt here for follow up and management of chronic medical problems. A home visit was made today to follow this patient who has now become bed confined with his Parkinson's disease and immobility and BPH with indwelling catheter. His memory disturbance has progressed. He is currently being taken care of by his wife and family friends. He continues to have a lot of pain in the left shoulder secondary to his previous shoulder surgery. He saw the urologist this week and they agreed for leaving the catheter in place. They felt that a twice a month catheter change would be appropriate. This was to be done by home health. The wife was present at the visit and she seemed to be coping well and handling the situation and a very positive way. The patient was pleasant and smiling but did not know my name or which office I came from.        Patient Active Problem List   Diagnosis Date Noted  . Urinary retention 08/31/2013  . AKI (acute kidney injury) 08/30/2013  . Fever, unspecified 08/30/2013  . Dehydration 08/26/2013  . Parkinson's disease 08/26/2013  . Hypokalemia 08/26/2013  . Acute encephalopathy 08/26/2013  . Memory disturbance 01/07/2013  . Other vitamin B12 deficiency anemia 08/31/2012  . Other general symptoms(780.99) 08/31/2012  . 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 Paralysis agitans 08/31/2012  . Other specified disorder of penis   . Other and unspecified hyperlipidemia   . Pain in joint, shoulder region   . Other chest pain   . Dry eyes   . Inguinal hernia   . Cataract   . CARCINOMA, BASAL CELL 05/04/2009  . COLONIC POLYPS 05/04/2009  . HYPERTENSION 05/04/2009  . MITRAL VALVE PROLAPSE, HX OF 05/04/2009  . BENIGN PROSTATIC HYPERTROPHY, HX OF 05/04/2009   Outpatient Encounter Prescriptions as of 10/10/2013  Medication Sig  .  ALPRAZolam (XANAX) 1 MG tablet Take 1 tablet (1 mg total) by mouth at bedtime as needed for anxiety.  Marland Kitchen amLODipine (NORVASC) 5 MG tablet Take 5 mg by mouth daily.  Marland Kitchen aspirin 81 MG tablet Take 81 mg by mouth at bedtime.   . carbidopa-levodopa (SINEMET IR) 25-100 MG per tablet Take 0.5 tablets by mouth 3 (three) times daily.  . Cholecalciferol 2000 UNITS CAPS Take 2,000 Units by mouth daily.  . clindamycin (CLEOCIN T) 1 % lotion   . enalapril-hydrochlorothiazide (VASERETIC) 10-25 MG per tablet TAKE ONE TABLET BY MOUTH ONE TIME DAILY  . feeding supplement, ENSURE, (ENSURE) PUDG Take 1 Container by mouth 2 (two) times daily between meals.  . meloxicam (MOBIC) 15 MG tablet Take 7.5 mg by mouth daily.  . mirtazapine (REMERON) 15 MG tablet Take 15-30 mg by mouth at bedtime.  Marland Kitchen nystatin (MYCOSTATIN/NYSTOP) 100000 UNIT/GM POWD Apply to affected area bid as directed  . Pitavastatin Calcium (LIVALO) 4 MG TABS Take 1 tablet (4 mg total) by mouth at bedtime.  . potassium chloride SA (K-DUR,KLOR-CON) 10 MEQ tablet Take 4 tablets (40 mEq total) by mouth daily.  . QUEtiapine (SEROQUEL) 50 MG tablet Take 100 mg by mouth at bedtime.  . rivastigmine (EXELON) 6 MG capsule Take 6 mg by mouth 2 (two) times daily.  . tamsulosin (FLOMAX) 0.4 MG CAPS capsule Take 1 capsule (0.4 mg total) by mouth daily.  . traMADol (ULTRAM) 50 MG tablet Take 1 tablet (  50 mg total) by mouth every 6 (six) hours as needed. For pain    Review of Systems  Constitutional: Negative.   HENT: Negative.   Eyes: Negative.   Respiratory: Negative.   Cardiovascular: Negative.   Gastrointestinal: Negative.   Endocrine: Negative.   Genitourinary: Negative.   Musculoskeletal: Negative.   Skin: Negative.   Allergic/Immunologic: Negative.   Neurological: Negative.   Hematological: Negative.   Psychiatric/Behavioral: Negative.        Objective:   Physical Exam  Nursing note and vitals reviewed. Constitutional: He is oriented to person,  place, and time. He appears well-developed and well-nourished. No distress.  Patient was pleasant but unable to sit up because of his severe shoulder pain secondary to previous surgery.  HENT:  Head: Normocephalic and atraumatic.  Right Ear: External ear normal.  Left Ear: External ear normal.  Nose: Nose normal.  Mouth/Throat: Oropharynx is clear and moist. No oropharyngeal exudate.   Oropharynx was dry  Eyes: Conjunctivae and EOM are normal. Pupils are equal, round, and reactive to light. Right eye exhibits no discharge. Left eye exhibits no discharge. No scleral icterus.  Neck: Normal range of motion. Neck supple. No thyromegaly present.  Somewhat nuchal rigidity  Cardiovascular: Normal rate, regular rhythm, normal heart sounds and intact distal pulses.  Exam reveals no gallop and no friction rub.   No murmur heard. Pulmonary/Chest: Effort normal and breath sounds normal. No respiratory distress. He has no wheezes. He has no rales. He exhibits no tenderness.  Breath sounds were normal anteriorly with no chest wall masses or axillary adenopathy  Abdominal: Soft. Bowel sounds are normal. He exhibits no mass. There is no tenderness. There is no rebound and no guarding.  Musculoskeletal: Normal range of motion. He exhibits no edema and no tenderness.  Decreased movement of left upper extremity view due to shoulder pain from surgery. There is some mild edema in the hold extremity. Lower extremities no edema good color skin intact  Lymphadenopathy:    He has no cervical adenopathy.  Neurological: He is alert and oriented to person, place, and time.  Tremor both hands right greater than left minimal movement of left hand compared to right some fairly good grip strength on the right  Skin: Skin is warm and dry. No rash noted. No erythema. No pallor.  Psychiatric: His behavior is normal.  Quiet affect. Judgment and memory are impaired   BP 126/77  Pulse 84  Temp(Src) 97.8 F (36.6 C) (Oral)   Ht 5\' 7"  (1.702 m)  The face-to-face time for this home visit with the patient and his wife was 30 minutes not counting the time of traveling to the residence and  back      Assessment & Plan:  1. HYPERTENSION  2. Memory disturbance  3. Parkinson's disease  4. Urinary retention  5. 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 Other vitamin B12 deficiency anemia  7. Pain in joint, shoulder region  8. Other and unspecified hyperlipidemia  9. BENIGN PROSTATIC HYPERTROPHY, HX OF  Patient Instructions  Continue current medications. Continue good therapeutic lifestyle changes which include good diet and exercise. Fall precautions discussed with patient. Schedule your flu vaccine if you haven't had it yet If you are over 68 years old - you may need Prevnar 80 or the adult Pneumonia vaccine. Continue the Foley catheter with change of catheter every 2 weeks by home health Check urinalysis once monthly Check BMP CBC and liver function tests at least  every 3 months   Arrie Senate MD

## 2013-10-10 NOTE — Telephone Encounter (Signed)
Nystatin powder done, we will set up home visit.

## 2013-10-10 NOTE — Patient Instructions (Addendum)
Continue current medications. Continue good therapeutic lifestyle changes which include good diet and exercise. Fall precautions discussed with patient. Schedule your flu vaccine if you haven't had it yet If you are over 75 years old - you may need Prevnar 57 or the adult Pneumonia vaccine. Continue the Foley catheter with change of catheter every 2 weeks by home health Check urinalysis once monthly Check BMP CBC and liver function tests at least every 3 months

## 2013-10-11 ENCOUNTER — Telehealth: Payer: Self-pay | Admitting: Neurology

## 2013-10-11 ENCOUNTER — Ambulatory Visit: Payer: Self-pay | Admitting: Neurology

## 2013-10-11 MED ORDER — ALPRAZOLAM 1 MG PO TABS: 1.0000 mg | ORAL_TABLET | Freq: Three times a day (TID) | ORAL | Status: DC | PRN

## 2013-10-11 MED ORDER — QUETIAPINE FUMARATE 50 MG PO TABS: ORAL_TABLET | ORAL | Status: AC

## 2013-10-11 NOTE — Telephone Encounter (Signed)
I called the patient. The patient was in the hospital for a bladder infection, bladder outlet obstruction. The patient is now more confused and somewhat agitated. The patient is not doing well. Will go up on the Seroquel taking 50 mg the morning, 100 mg in the evening. The patient will continue the alprazolam if needed.

## 2013-10-11 NOTE — Telephone Encounter (Signed)
Dr Jannifer Franklin, please advise.  Thank you.

## 2013-10-11 NOTE — Telephone Encounter (Signed)
Patient's wife calling to say that patient's health is declining - he is getting really agitated and his memory is back to childhood, talking to the walls - daytime is worse. Asking if Dr. Jannifer Franklin could call something in to help calm him down.  Please call wife to advise.

## 2013-10-14 ENCOUNTER — Telehealth: Payer: Self-pay | Admitting: Family Medicine

## 2013-10-16 ENCOUNTER — Telehealth: Payer: Self-pay | Admitting: Family Medicine

## 2013-10-16 NOTE — Telephone Encounter (Signed)
For gina

## 2013-10-17 ENCOUNTER — Telehealth: Payer: Self-pay | Admitting: *Deleted

## 2013-10-17 NOTE — Telephone Encounter (Signed)
Wife would like a referral to Hospice for pt. Dr Laurance Flatten gave order. Referral made 10/17/13

## 2013-10-28 ENCOUNTER — Other Ambulatory Visit: Payer: Self-pay | Admitting: Neurology

## 2013-10-28 ENCOUNTER — Other Ambulatory Visit: Payer: Self-pay | Admitting: *Deleted

## 2013-10-28 DIAGNOSIS — Z515 Encounter for palliative care: Secondary | ICD-10-CM

## 2013-10-28 NOTE — Progress Notes (Signed)
DNR scanned into chart for Hospice Care

## 2013-10-30 ENCOUNTER — Telehealth: Payer: Self-pay | Admitting: *Deleted

## 2013-10-30 MED ORDER — NYSTATIN 100000 UNIT/GM EX POWD: CUTANEOUS | Status: DC

## 2013-10-30 NOTE — Telephone Encounter (Signed)
HOSPICE REQUESTED RF ON NYSTATIN POWDER FOR PT

## 2013-11-15 ENCOUNTER — Other Ambulatory Visit: Payer: Self-pay | Admitting: *Deleted

## 2013-11-15 MED ORDER — MIRTAZAPINE 15 MG PO TABS: 30.0000 mg | ORAL_TABLET | Freq: Every day | ORAL | Status: DC

## 2013-12-05 ENCOUNTER — Other Ambulatory Visit: Payer: Self-pay | Admitting: *Deleted

## 2013-12-20 ENCOUNTER — Other Ambulatory Visit: Payer: Self-pay | Admitting: Family Medicine

## 2013-12-24 ENCOUNTER — Other Ambulatory Visit: Payer: Self-pay | Admitting: Family Medicine

## 2013-12-24 ENCOUNTER — Other Ambulatory Visit: Payer: Self-pay | Admitting: Neurology

## 2014-01-22 ENCOUNTER — Other Ambulatory Visit: Payer: Self-pay | Admitting: *Deleted

## 2014-01-22 MED ORDER — NYSTATIN 100000 UNIT/GM EX POWD: CUTANEOUS | Status: DC

## 2014-02-03 ENCOUNTER — Other Ambulatory Visit: Payer: Self-pay | Admitting: *Deleted

## 2014-02-03 MED ORDER — NYSTATIN 100000 UNIT/GM EX POWD: CUTANEOUS | Status: AC

## 2014-02-07 ENCOUNTER — Ambulatory Visit: Payer: Self-pay | Admitting: Neurology

## 2014-02-10 ENCOUNTER — Other Ambulatory Visit: Payer: Self-pay | Admitting: Family Medicine

## 2014-02-25 ENCOUNTER — Ambulatory Visit: Payer: Medicare Other | Admitting: Family Medicine

## 2014-02-25 DIAGNOSIS — G20A1 Parkinson's disease without dyskinesia, without mention of fluctuations: Secondary | ICD-10-CM | POA: Diagnosis not present

## 2014-02-25 DIAGNOSIS — G20 Parkinson's disease: Secondary | ICD-10-CM

## 2014-02-25 DIAGNOSIS — R413 Other amnesia: Secondary | ICD-10-CM | POA: Diagnosis not present

## 2014-02-25 DIAGNOSIS — R5383 Other fatigue: Secondary | ICD-10-CM

## 2014-02-25 DIAGNOSIS — R5381 Other malaise: Secondary | ICD-10-CM | POA: Diagnosis not present

## 2014-03-04 ENCOUNTER — Ambulatory Visit: Payer: Self-pay | Admitting: Neurology

## 2014-03-05 ENCOUNTER — Other Ambulatory Visit: Payer: Self-pay | Admitting: *Deleted

## 2014-03-05 MED ORDER — TRAMADOL HCL 50 MG PO TABS: ORAL_TABLET | ORAL | Status: AC

## 2014-03-05 NOTE — Telephone Encounter (Signed)
Hospice pt. Call to [facility] if approved. Last refill 02/10/14.

## 2014-03-10 ENCOUNTER — Ambulatory Visit: Payer: Medicare Other | Admitting: Family Medicine

## 2014-03-10 ENCOUNTER — Encounter: Payer: Self-pay | Admitting: Family Medicine

## 2014-03-10 VITALS — BP 144/60 | HR 60 | Temp 98.4°F | Ht 67.0 in

## 2014-03-10 DIAGNOSIS — N476 Balanoposthitis: Secondary | ICD-10-CM

## 2014-03-10 DIAGNOSIS — M2450 Contracture, unspecified joint: Secondary | ICD-10-CM | POA: Diagnosis not present

## 2014-03-10 DIAGNOSIS — R413 Other amnesia: Secondary | ICD-10-CM

## 2014-03-10 DIAGNOSIS — G3183 Dementia with Lewy bodies: Secondary | ICD-10-CM | POA: Diagnosis not present

## 2014-03-10 DIAGNOSIS — G20A1 Parkinson's disease without dyskinesia, without mention of fluctuations: Secondary | ICD-10-CM | POA: Diagnosis not present

## 2014-03-10 DIAGNOSIS — Z7401 Bed confinement status: Secondary | ICD-10-CM | POA: Diagnosis not present

## 2014-03-10 DIAGNOSIS — F0280 Dementia in other diseases classified elsewhere without behavioral disturbance: Secondary | ICD-10-CM | POA: Diagnosis not present

## 2014-03-10 DIAGNOSIS — G20 Parkinson's disease: Secondary | ICD-10-CM

## 2014-03-10 DIAGNOSIS — I10 Essential (primary) hypertension: Secondary | ICD-10-CM

## 2014-03-10 DIAGNOSIS — N481 Balanitis: Secondary | ICD-10-CM

## 2014-03-10 NOTE — Patient Instructions (Addendum)
Continue current medications. Continue good therapeutic lifestyle changes which include good diet and exercise. Fall precautions discussed with patient. If an FOBT was given today- please return it to our front desk. If you are over 75 years old - you may need Prevnar 23 or the adult Pneumonia vaccine.  Continue home bound care with hospice Continue catheter Continue skin care Make patient as comfortable as possible

## 2014-03-10 NOTE — Progress Notes (Signed)
Subjective:    Patient ID: Joseph Rice, male    DOB: Dec 25, 1940, 73 y.o.   MRN: [id number]  HPI Patient was seen in Home today for follow up of chronic medical problems. The patient has a history of Lewy body dementia and Parkinson's disease. He is terminal. He is being followed by hospice. He has a hospital bed in the home with soft music and lighting. He has caregivers around the clock including care from his wife. He appears to be well nourished and stable. He complains of no pain. He has has a indwelling catheter for some time now, it was recently replaced and is now leaking some. The homehealth nurse is to come out today to replace it.           Patient Active Problem List   Diagnosis Date Noted  . Urinary retention 08/31/2013  . AKI (acute kidney injury) 08/30/2013  . Fever, unspecified 08/30/2013  . Dehydration 08/26/2013  . Parkinson's disease 08/26/2013  . Hypokalemia 08/26/2013  . Acute encephalopathy 08/26/2013  . Memory disturbance 01/07/2013  . Other vitamin B12 deficiency anemia 08/31/2012  . Other general symptoms(780.99) 08/31/2012  . 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 Paralysis agitans 08/31/2012  . Other specified disorder of penis   . Other and unspecified hyperlipidemia   . Pain in joint, shoulder region   . Other chest pain   . Dry eyes   . Inguinal hernia   . Cataract   . CARCINOMA, BASAL CELL 05/04/2009  . COLONIC POLYPS 05/04/2009  . HYPERTENSION 05/04/2009  . MITRAL VALVE PROLAPSE, HX OF 05/04/2009  . BENIGN PROSTATIC HYPERTROPHY, HX OF 05/04/2009   Outpatient Encounter Prescriptions as of 03/10/2014  Medication Sig  . ALPRAZolam (XANAX) 1 MG tablet Take 1 tablet (1 mg total) by mouth 3 (three) times daily as needed for anxiety.  Marland Kitchen amLODipine (NORVASC) 5 MG tablet Take 5 mg by mouth daily.  Marland Kitchen aspirin 81 MG tablet Take 81 mg by mouth at bedtime.   . carbidopa-levodopa (SINEMET IR) 25-100 MG  per tablet TAKE ONE HALF TABLET BY MOUTH THREE TIMES DAILY  . Cholecalciferol 2000 UNITS CAPS Take 2,000 Units by mouth daily.  . clindamycin (CLEOCIN T) 1 % lotion   . feeding supplement, ENSURE, (ENSURE) PUDG Take 1 Container by mouth 2 (two) times daily between meals.  . meloxicam (MOBIC) 15 MG tablet Take 7.5 mg by mouth daily.  . mirtazapine (REMERON) 15 MG tablet Take 2 tablets (30 mg total) by mouth at bedtime.  Marland Kitchen nystatin (MYCOSTATIN/NYSTOP) 100000 UNIT/GM POWD Apply to affected area bid as directed  . Pitavastatin Calcium (LIVALO) 4 MG TABS Take 1 tablet (4 mg total) by mouth at bedtime.  . potassium chloride SA (K-DUR,KLOR-CON) 10 MEQ tablet Take 4 tablets (40 mEq total) by mouth daily.  . QUEtiapine (SEROQUEL) 50 MG tablet One tablet in the morning and two tablets in the evening  . rivastigmine (EXELON) 6 MG capsule Take 6 mg by mouth 2 (two) times daily.  . rivastigmine (EXELON) 6 MG capsule TAKE ONE CAPSULE BY MOUTH TWICE DAILY  . tamsulosin (FLOMAX) 0.4 MG CAPS capsule TAKE 1 CAPSULE (0.4 MG TOTAL) BY MOUTH DAILY.  . traMADol (ULTRAM) 50 MG tablet TAKE 1 TABLET BY MOUTH EVERY SIX HOURS AS NEEDED.    Review of Systems  Constitutional: Positive for activity change.  HENT: Negative.   Eyes: Negative.   Respiratory: Positive for cough.   Cardiovascular: Negative.  Pedal edema  Gastrointestinal: Negative.   Endocrine: Negative.   Genitourinary: Negative.   Musculoskeletal: Negative.   Skin: Negative.   Allergic/Immunologic: Negative.   Neurological: Positive for tremors (bilateral hands).  Hematological: Negative.   Psychiatric/Behavioral: Negative.        Objective:   Physical Exam BP 144/60  Pulse 60  Temp(Src) 98.4 F (36.9 C) (Oral)  Ht 5\' 7"  (1.702 m)  His time was somewhat dry I was unable to visualize his posterior throat. Lungs are clear anteriorly and posteriorly. Heart is 60 beats/min and slightly irregualr. Slight Pedal edema. Bilateral arm  rigidity. Tremors in bilateral hands. Skin is in good condition without breakdown. The abdomen was non-tender to palpation He does have an indwelling catheter and some balanitis which according to wife has been present for a good while. Neurologically I think the patient recognized me but he may not have. He is confined to a hospital bed and is being seen by hospice     BP 144/60  Pulse 60  Temp(Src) 98.4 F (36.9 C) (Oral)  Ht 5\' 7"  (1.702 m)  This was a face-to-face visit for about 40 minutes. The patient was examined and there was a lengthy discussion with his wife about his care.      Assessment & Plan:  1. HYPERTENSION  2. Memory disturbance  3. Parkinson's disease  4. Lewy body dementia  5. Bedridden  6. Flexion contractures  7. Balanitis  Patient Instructions  Continue current medications. Continue good therapeutic lifestyle changes which include good diet and exercise. Fall precautions discussed with patient. If an FOBT was given today- please return it to our front desk. If you are over 58 years old - you may need Prevnar 27 or the adult Pneumonia vaccine.  Continue home bound care with hospice Continue catheter Continue skin care Make patient as comfortable as possible   Arrie Senate MD

## 2014-03-18 ENCOUNTER — Other Ambulatory Visit: Payer: Self-pay | Admitting: *Deleted

## 2014-03-18 MED ORDER — MIRTAZAPINE 15 MG PO TABS: 30.0000 mg | ORAL_TABLET | Freq: Every day | ORAL | Status: AC

## 2014-03-18 MED ORDER — ALPRAZOLAM 1 MG PO TABS: 1.0000 mg | ORAL_TABLET | Freq: Three times a day (TID) | ORAL | Status: AC | PRN

## 2014-03-18 NOTE — Telephone Encounter (Signed)
Patient last seen in office on 03-10-14. Rx last filled on 03-03-14 for #[id number]. Please advise. If approved please route to Pool A so nurse can phone in to [facility] at [phone]

## 2014-03-20 ENCOUNTER — Telehealth: Payer: Self-pay | Admitting: *Deleted

## 2014-03-20 NOTE — Telephone Encounter (Signed)
Please have a hospice nurse or a visiting nurse do a culture and sensitivity on the drainage before starting the antibiotic which we will call in. Start doxycycline 100 mg twice daily for 2 weeks with food or nourishment

## 2014-03-20 NOTE — Telephone Encounter (Signed)
Hospice nurse called and stated that he saw patient this am and he had a raised cyst like area to his left nipple and it has now burst and has greenish white drainage and nurse and wife are concerned it may be infection. Would you please call in an antibiotic to [facility] and call wife and let her know so she can pick up

## 2014-03-21 ENCOUNTER — Other Ambulatory Visit: Payer: Self-pay | Admitting: Family Medicine

## 2014-03-21 NOTE — Telephone Encounter (Signed)
Spoke with Hospice nurse and told to get a culture if possible. States that he will try. Called antibiotic to [facility]. Wife notified

## 2014-04-29 DEATH — deceased

## 2015-03-20 IMAGING — CT CT HEAD W/O CM
2 series · 16 of 30 positions shown, 20 images · non-contrast
Comparison: CT head without contrast 08/05/2013.

CLINICAL DATA: Weakness. Poor oral intake. Unable to get out of
bed.

EXAM:
CT HEAD WITHOUT CONTRAST
TECHNIQUE: Contiguous axial images were obtained from the base of the skull
through the vertex without intravenous contrast.

[Series 2: head w/o · axial · non-contrast · 0.49mm/px · z∈[+107,+262]mm · 13 of 37 slices shown, 17 images]
[im 3/37  brain]
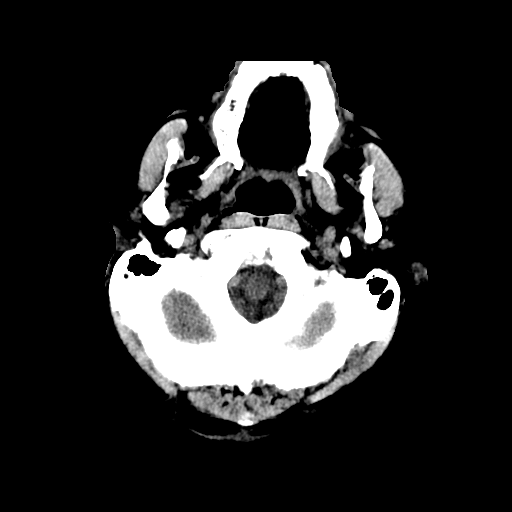
[im 3/37  bone]
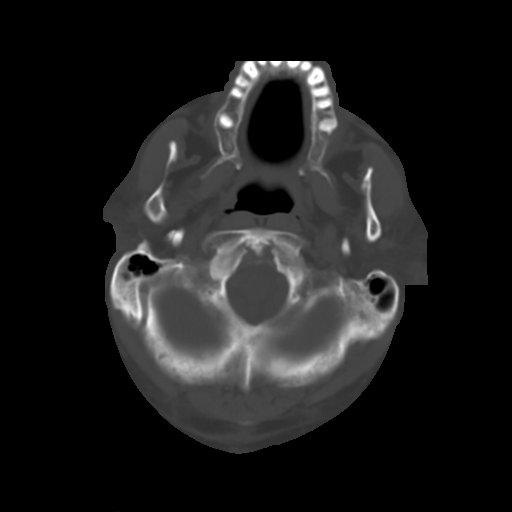
[im 6/37  brain]
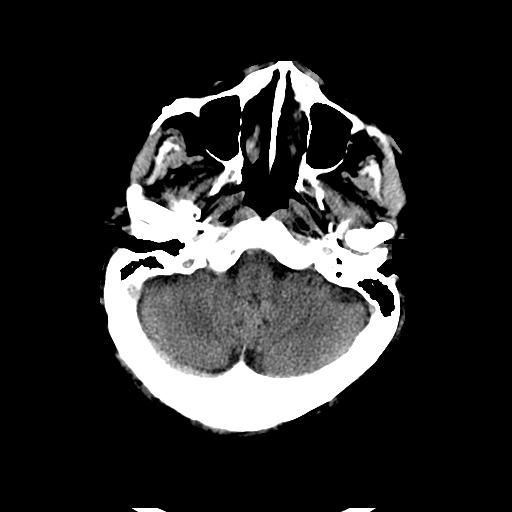
[im 8/37  brain]
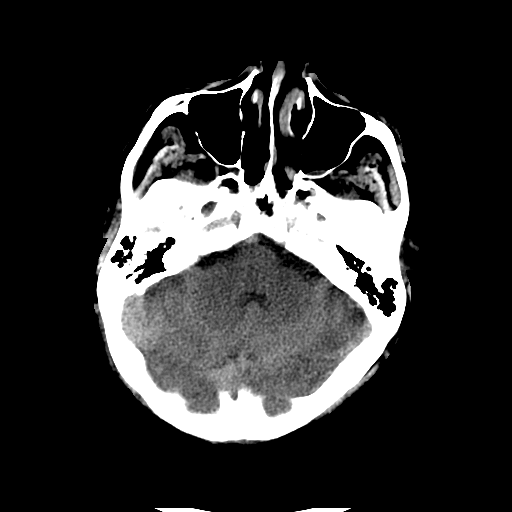
[im 11/37  brain]
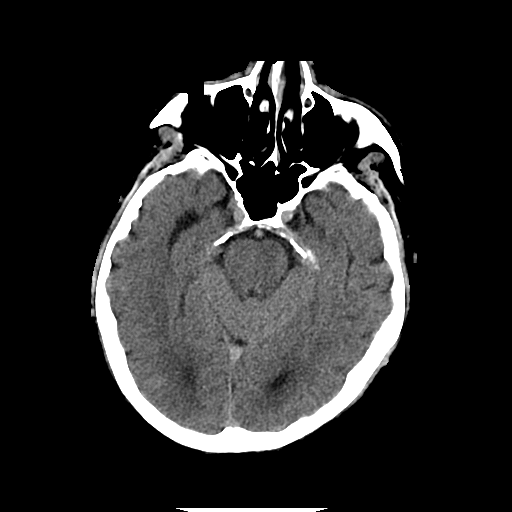
[im 13/37  brain]
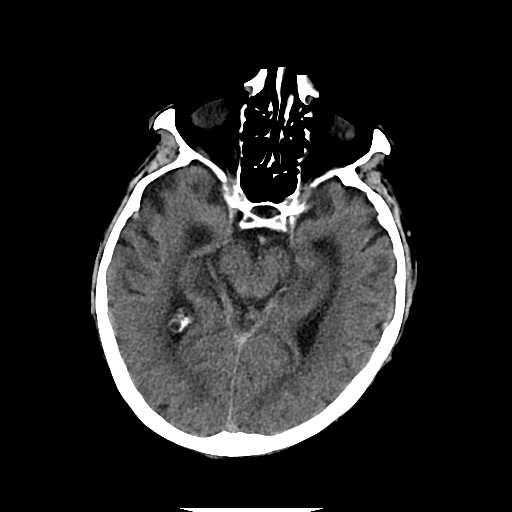
[im 13/37  bone]
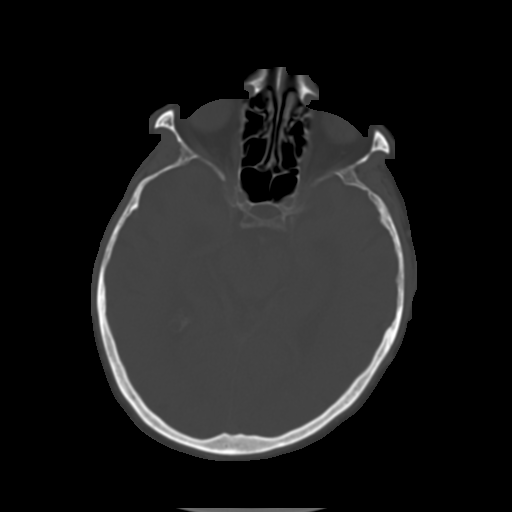
[im 16/37  brain]
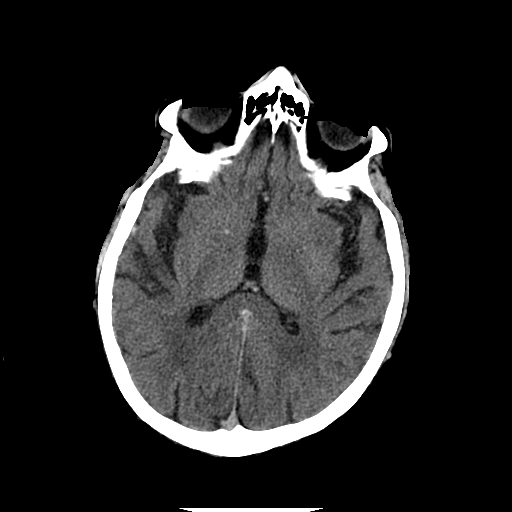
[im 19/37  brain]
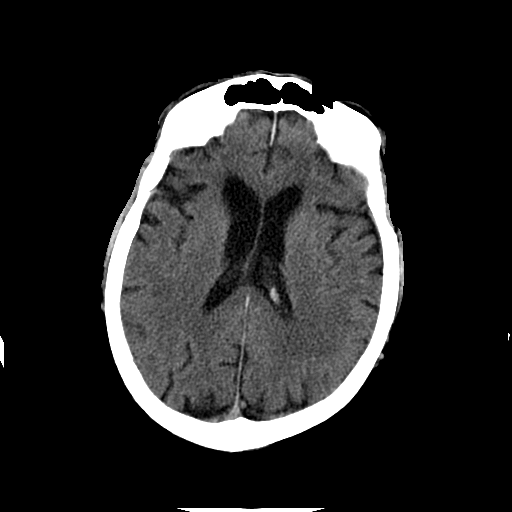
[im 21/37  brain]
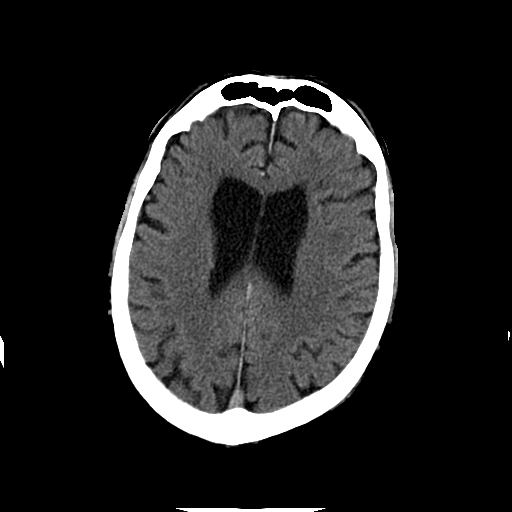
[im 24/37  brain]
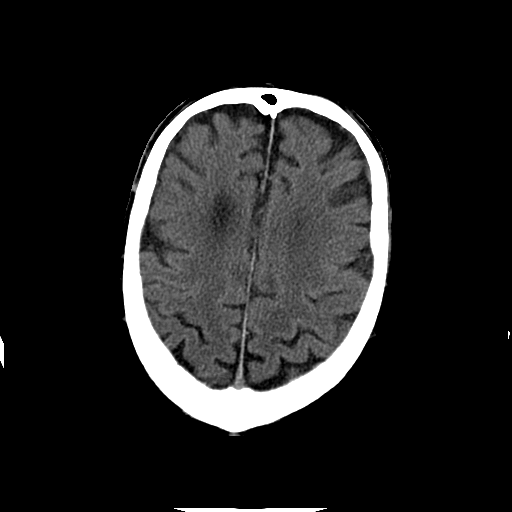
[im 24/37  bone]
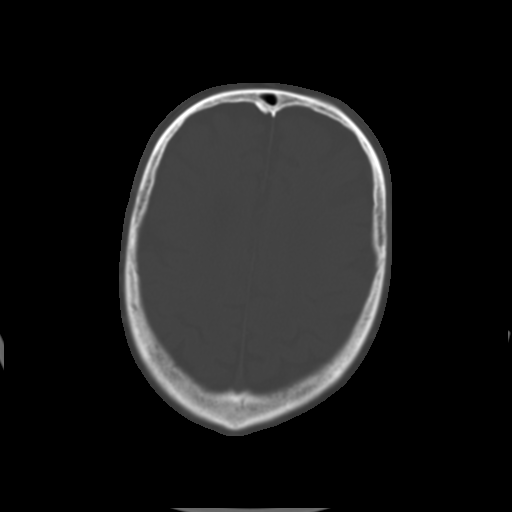
[im 26/37  brain]
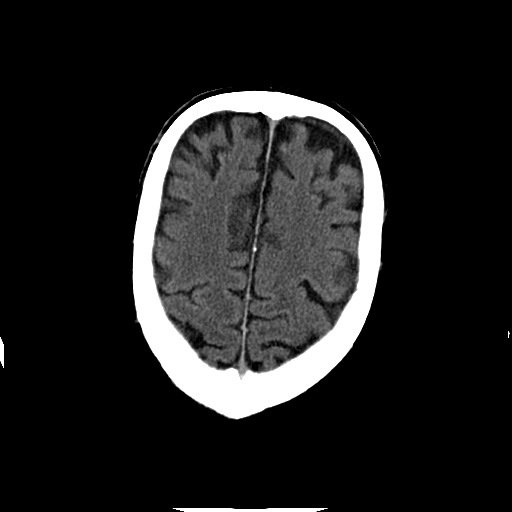
[im 29/37  brain]
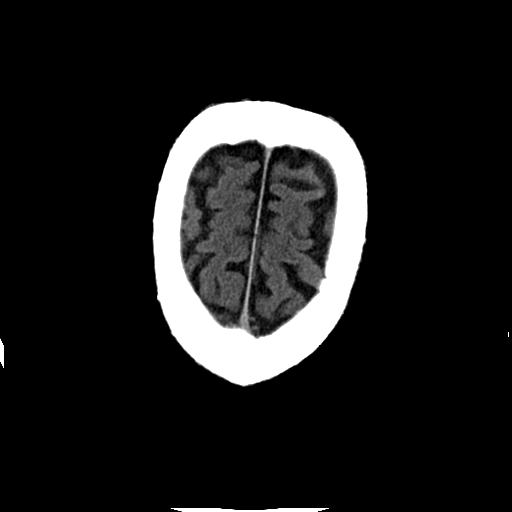
[im 31/37  brain]
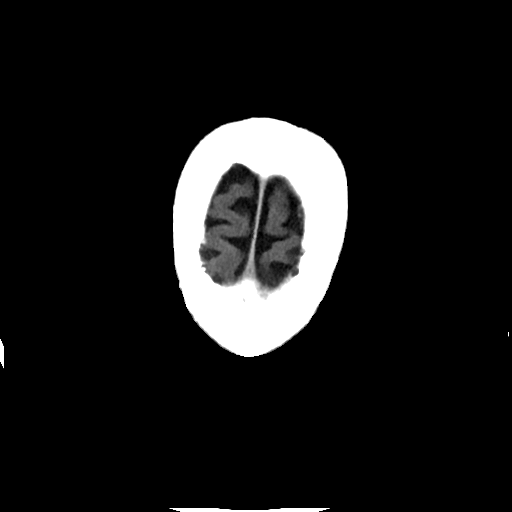
[im 34/37  brain]
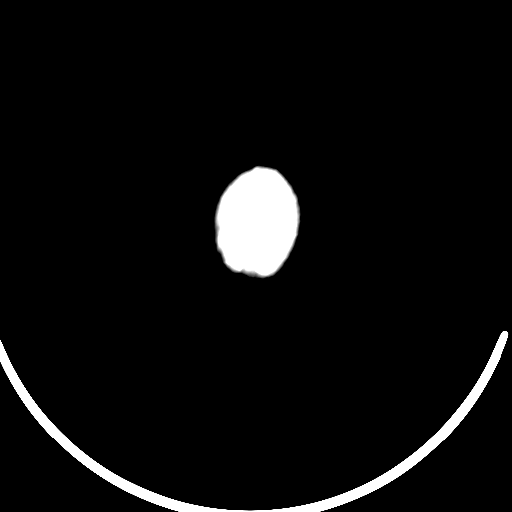
[im 34/37  bone]
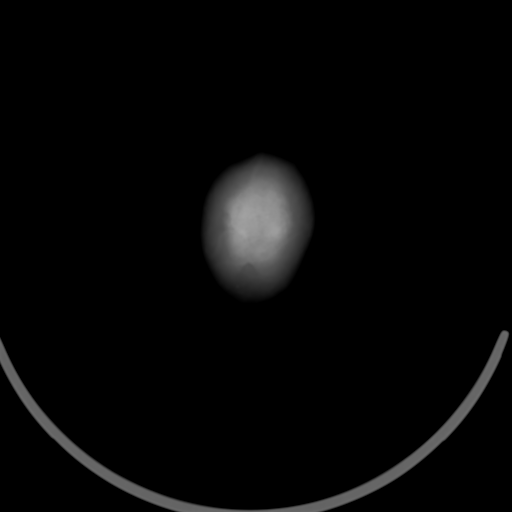

[Series 3: head w/o bone · axial · non-contrast · 0.49mm/px · z∈[+107,+147]mm · 3 of 37 slices shown]
[im 3/37  bone]
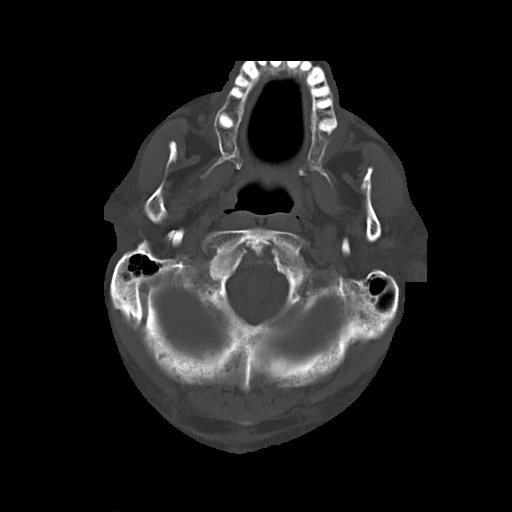
[im 8/37  bone]
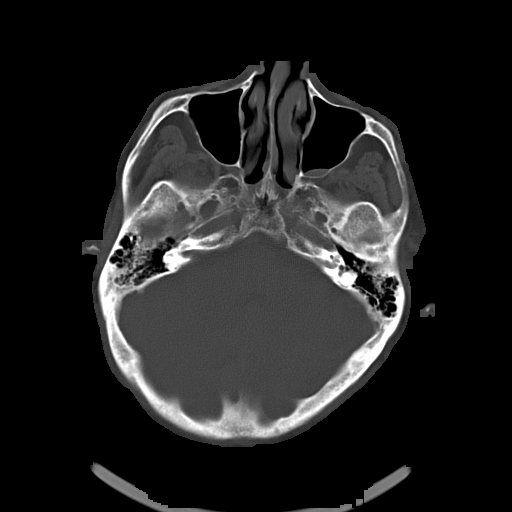
[im 11/37  bone]
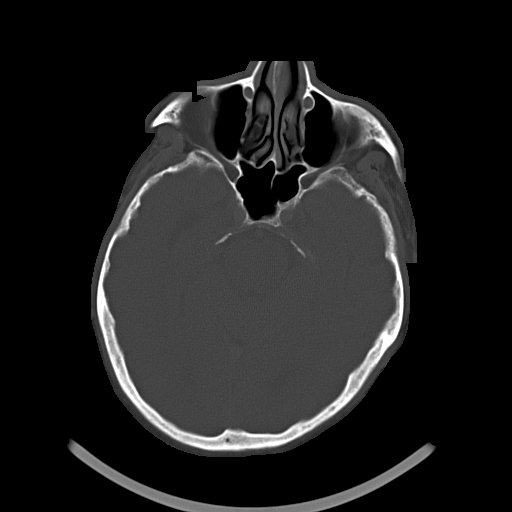

[16 of 30 positions shown; findings below may reference images not displayed]

FINDINGS: No acute cortical infarct, hemorrhage, or mass lesion is present.
Moderate generalized atrophy and white matter disease is stable. The
ventricles are proportionate to the degree of atrophy. No
significant extra-axial fluid collection is present.

The paranasal sinuses branch that and anterior left ethmoid air cell
is opacified. Minimal mucosal thickening is noted in the left
maxillary sinus. The paranasal sinuses and mastoid air cells are
otherwise clear.
IMPRESSION: 1. Stable atrophy and white matter disease.
2. No acute intracranial abnormality.
3. Minimal sinus disease.

## 2015-03-20 IMAGING — CR DG CHEST 1V
1 series · 1 of 1 positions shown · non-contrast
Comparison: 08/05/2013 and earlier.

CLINICAL DATA: 72-year-old male with shortness of breath. Weakness.
Initial encounter.

EXAM:
CHEST - 1 VIEW

[view not recorded]
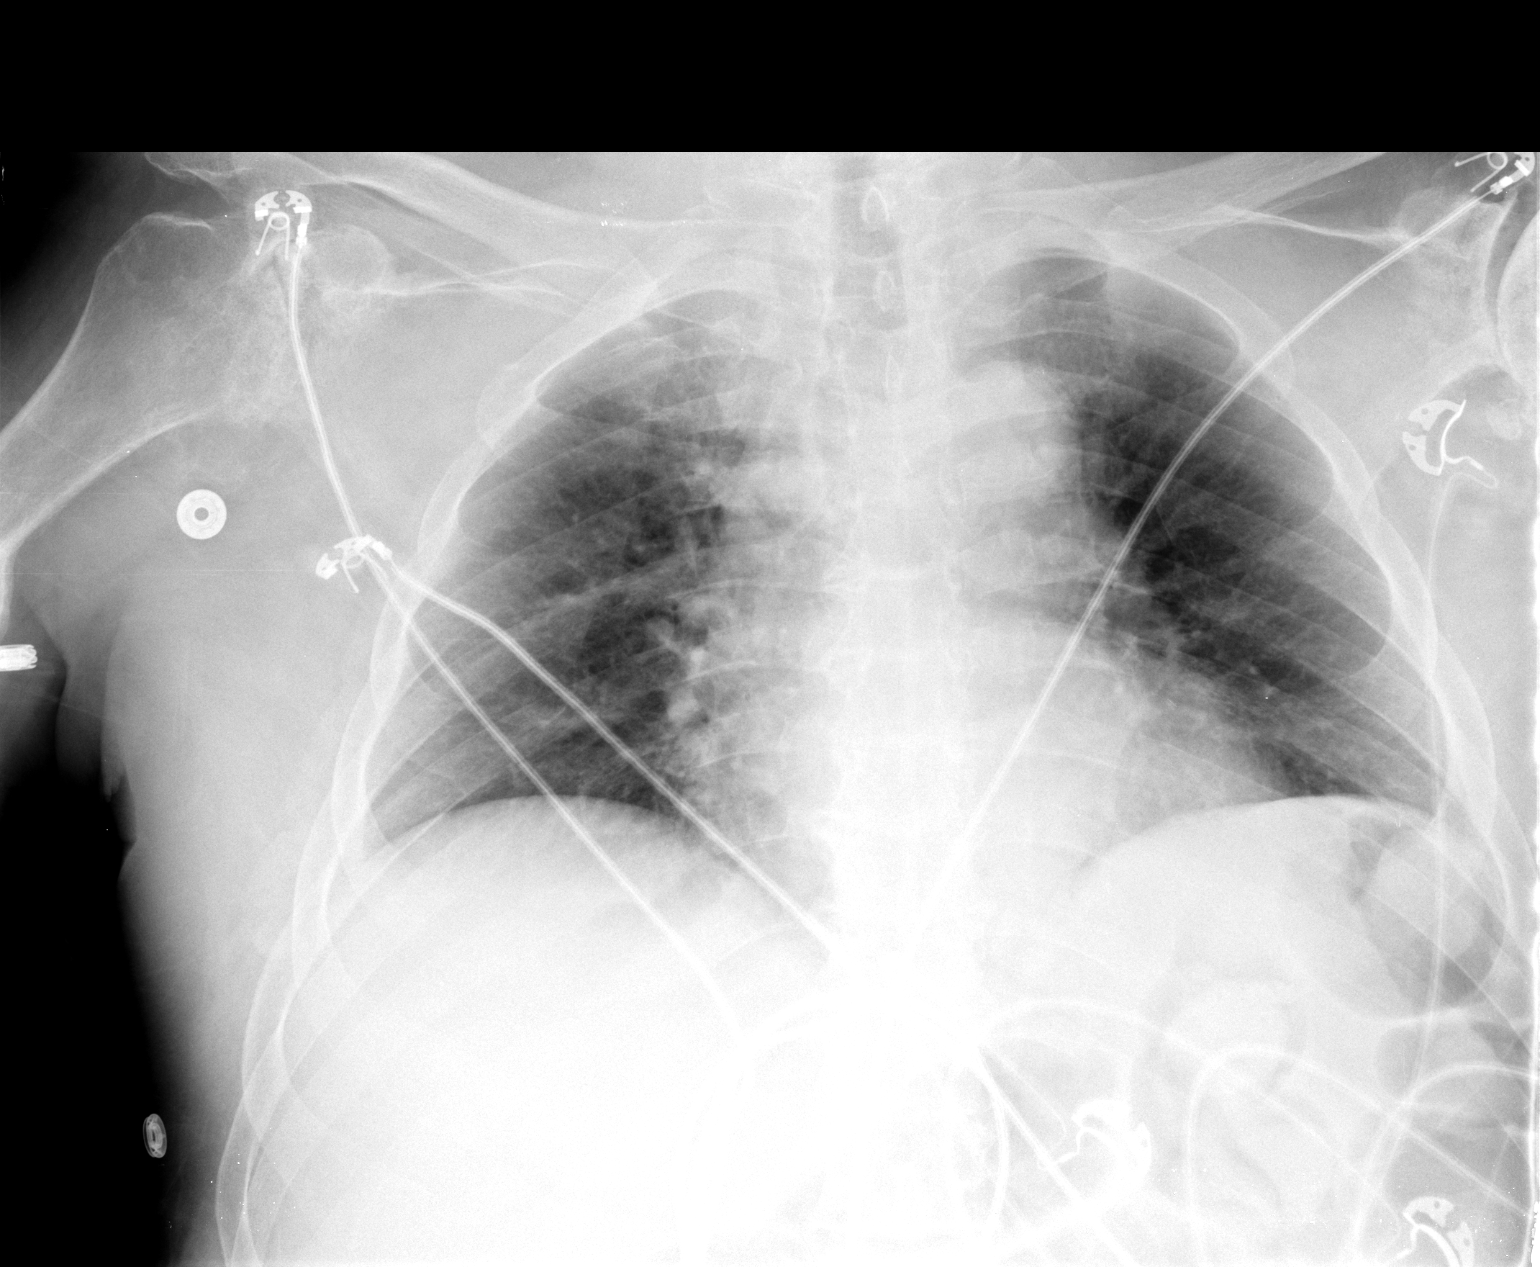

[1 of 1 positions shown; findings below may reference images not displayed]

FINDINGS: Portable AP semi upright view at 7452 hrs. Mildly lower lung
volumes. Stable cardiac size and mediastinal contours. Allowing for
portable technique, the lungs are clear. Advanced degenerative
osseous changes at both shoulders. No pneumothorax.
IMPRESSION: Low lung volumes, otherwise no acute cardiopulmonary abnormality.

## 2015-03-21 IMAGING — CR DG SHOULDER 2+V*L*
2 series · 2 of 2 positions shown · non-contrast
Comparison: None.

CLINICAL DATA: Shoulder pain

EXAM:
LEFT SHOULDER - 2+ VIEW

[x shoulder ap left]
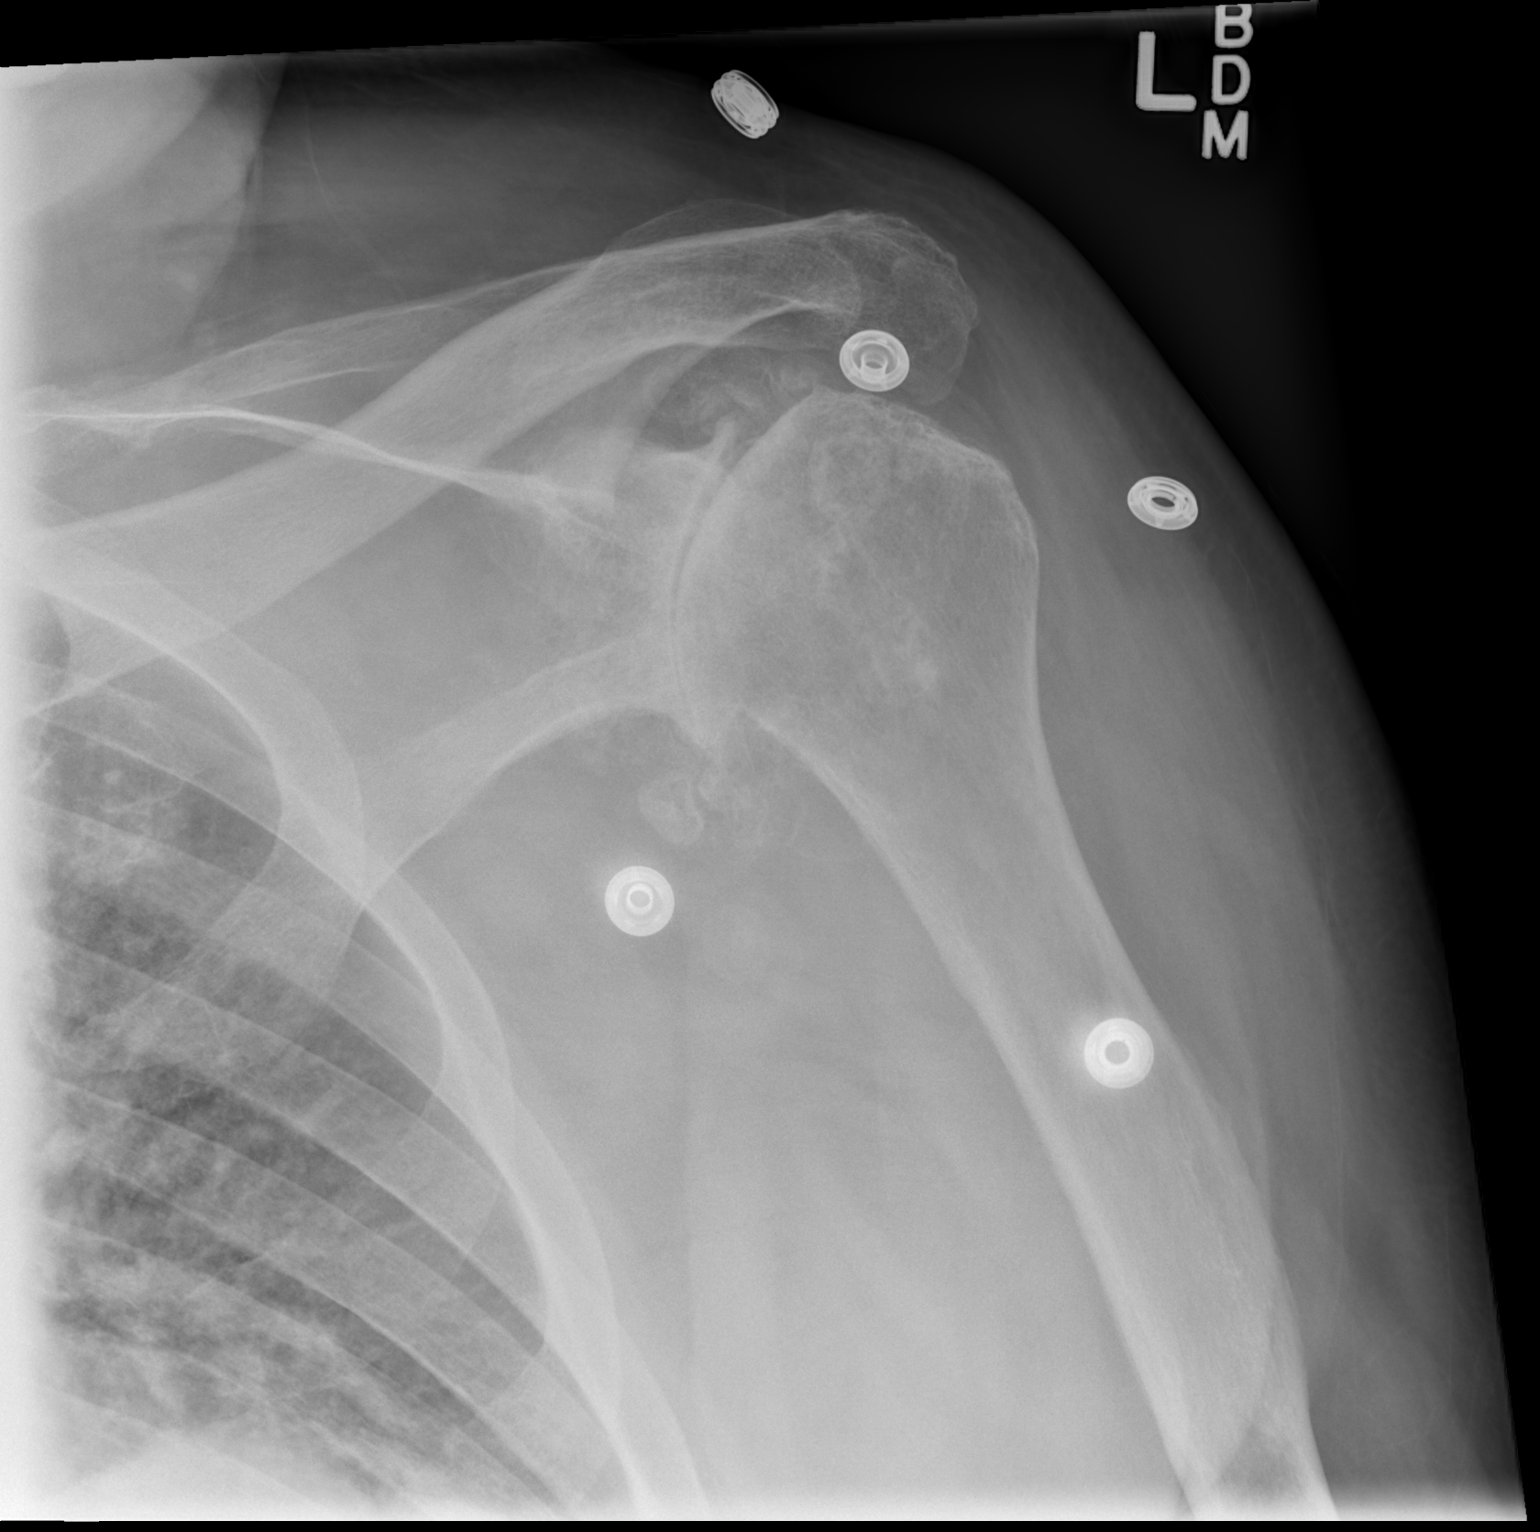

[x shoulder axillary left]
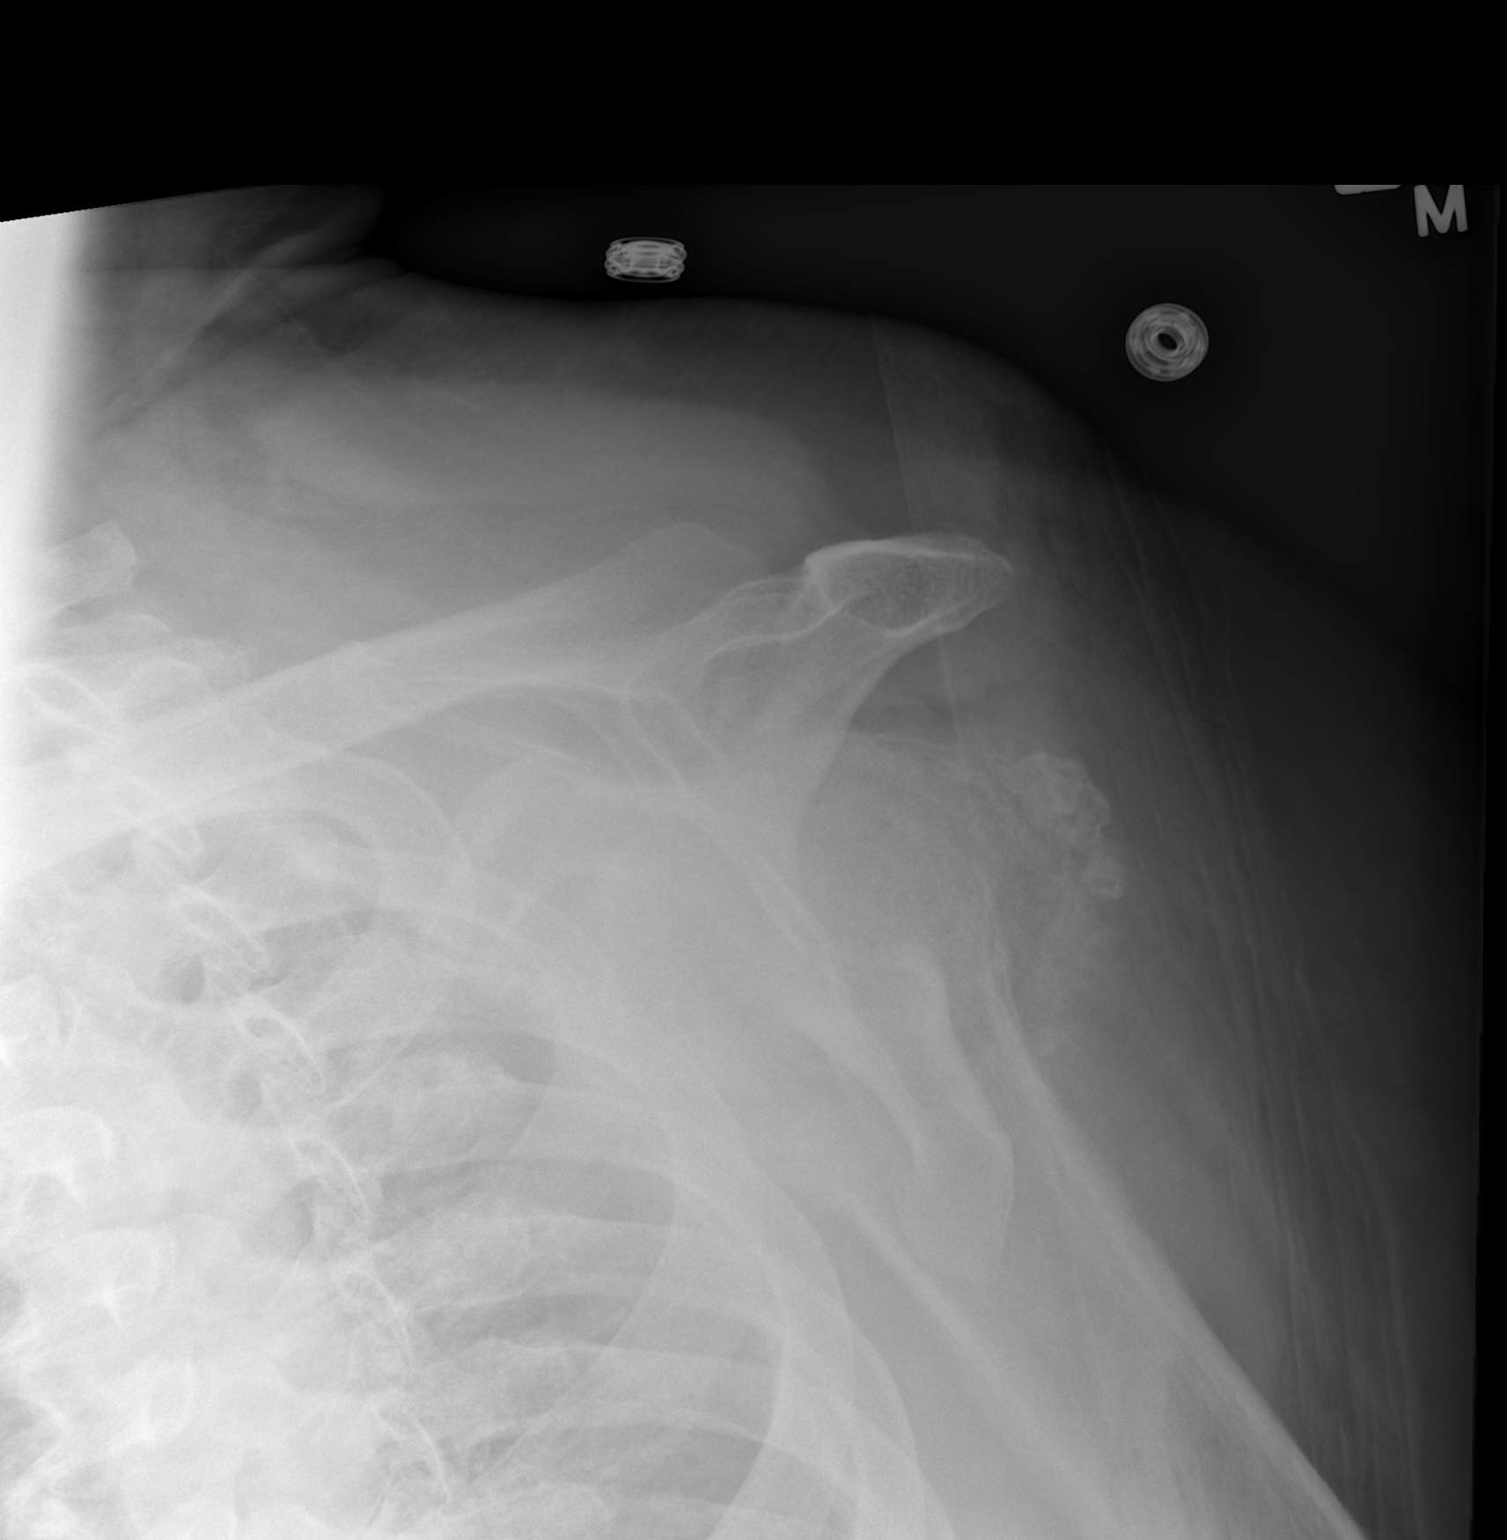

[2 of 2 positions shown; findings below may reference images not displayed]

FINDINGS: There is severe narrowing and near complete obliteration of the
glenohumeral joint. Deformity of the medial aspect of the humeral
head with flattening is appreciated. Marked hypertrophic bone
spurring is identified along the humeral head and glenoid. There
also findings concerning for loose bodies within the glenohumeral
joint. Areas of calcific tendonitis involving the supraspinatus
tendon are also diagnostic consideration. There is no evidence of
acute fracture nor dislocation.
IMPRESSION: Severe osteoarthritic changes within the glenohumeral joint. There
also findings concerning for loose bodies within the joint. Further
evaluation with MRI, noncontrast in is recommended.
